# Patient Record
Sex: Female | Born: 1990 | Race: Black or African American | Hispanic: No | Marital: Single | Smoking: Never smoker
Health system: Southern US, Community
[De-identification: ages and names within clinical notes are randomized; demographics above are authoritative.]

## PROBLEM LIST (undated history)

## (undated) ENCOUNTER — Inpatient Hospital Stay (HOSPITAL_COMMUNITY): Payer: Self-pay

## (undated) DIAGNOSIS — N83209 Unspecified ovarian cyst, unspecified side: Secondary | ICD-10-CM

## (undated) DIAGNOSIS — J45909 Unspecified asthma, uncomplicated: Secondary | ICD-10-CM

## (undated) DIAGNOSIS — O139 Gestational [pregnancy-induced] hypertension without significant proteinuria, unspecified trimester: Secondary | ICD-10-CM

## (undated) DIAGNOSIS — J069 Acute upper respiratory infection, unspecified: Secondary | ICD-10-CM

## (undated) HISTORY — DX: Acute upper respiratory infection, unspecified: J06.9

## (undated) HISTORY — PX: NO PAST SURGERIES: SHX2092

---
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
## 2013-01-26 ENCOUNTER — Emergency Department (HOSPITAL_COMMUNITY): Payer: BC Managed Care – PPO

## 2013-01-26 ENCOUNTER — Encounter (HOSPITAL_COMMUNITY): Payer: Self-pay | Admitting: Emergency Medicine

## 2013-01-26 ENCOUNTER — Emergency Department (HOSPITAL_COMMUNITY)
Admission: EM | Admit: 2013-01-26 | Discharge: 2013-01-26 | Disposition: A | Discharge status: Home / Self Care | Payer: BC Managed Care – PPO | Attending: Emergency Medicine | Admitting: Emergency Medicine

## 2013-01-26 DIAGNOSIS — S8002XA Contusion of left knee, initial encounter: Secondary | ICD-10-CM

## 2013-01-26 DIAGNOSIS — J45909 Unspecified asthma, uncomplicated: Secondary | ICD-10-CM | POA: Insufficient documentation

## 2013-01-26 DIAGNOSIS — Y9389 Activity, other specified: Secondary | ICD-10-CM | POA: Insufficient documentation

## 2013-01-26 DIAGNOSIS — IMO0002 Reserved for concepts with insufficient information to code with codable children: Secondary | ICD-10-CM | POA: Insufficient documentation

## 2013-01-26 DIAGNOSIS — Z79899 Other long term (current) drug therapy: Secondary | ICD-10-CM | POA: Insufficient documentation

## 2013-01-26 DIAGNOSIS — S0990XA Unspecified injury of head, initial encounter: Secondary | ICD-10-CM | POA: Insufficient documentation

## 2013-01-26 DIAGNOSIS — S8000XA Contusion of unspecified knee, initial encounter: Secondary | ICD-10-CM | POA: Insufficient documentation

## 2013-01-26 DIAGNOSIS — Y92410 Unspecified street and highway as the place of occurrence of the external cause: Secondary | ICD-10-CM | POA: Insufficient documentation

## 2013-01-26 HISTORY — DX: Unspecified asthma, uncomplicated: J45.909

## 2013-01-26 MED ORDER — CYCLOBENZAPRINE HCL 10 MG PO TABS: 10.0000 mg | ORAL_TABLET | Freq: Two times a day (BID) | ORAL | Status: DC | PRN

## 2013-01-26 NOTE — ED Notes (Signed)
The pt just returned from xray 
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
## 2013-01-26 NOTE — ED Provider Notes (Signed)
Medical screening examination/treatment/procedure(s) were performed by non-physician practitioner and as supervising physician I was immediately available for consultation/collaboration.    Denetria Luevanos R Jameria Bradway, MD 01/26/13 1849 
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
## 2013-02-23 ENCOUNTER — Emergency Department (HOSPITAL_COMMUNITY)
Admission: EM | Admit: 2013-02-23 | Discharge: 2013-02-23 | Discharge status: Against Medical Advice | Payer: No Typology Code available for payment source | Attending: Emergency Medicine | Admitting: Emergency Medicine

## 2013-02-23 ENCOUNTER — Encounter (HOSPITAL_COMMUNITY): Payer: Self-pay | Admitting: Emergency Medicine

## 2013-02-23 DIAGNOSIS — Y9389 Activity, other specified: Secondary | ICD-10-CM | POA: Insufficient documentation

## 2013-02-23 DIAGNOSIS — J45909 Unspecified asthma, uncomplicated: Secondary | ICD-10-CM | POA: Insufficient documentation

## 2013-02-23 DIAGNOSIS — X500XXA Overexertion from strenuous movement or load, initial encounter: Secondary | ICD-10-CM | POA: Insufficient documentation

## 2013-02-23 DIAGNOSIS — S239XXA Sprain of unspecified parts of thorax, initial encounter: Secondary | ICD-10-CM

## 2013-02-23 DIAGNOSIS — Z79899 Other long term (current) drug therapy: Secondary | ICD-10-CM | POA: Insufficient documentation

## 2013-02-23 DIAGNOSIS — Y92009 Unspecified place in unspecified non-institutional (private) residence as the place of occurrence of the external cause: Secondary | ICD-10-CM | POA: Insufficient documentation

## 2013-02-23 MED ORDER — KETOROLAC TROMETHAMINE 30 MG/ML IJ SOLN
30.0000 mg | Freq: Once | INTRAMUSCULAR | Status: AC
  Administered 2013-02-23: 30 mg via INTRAMUSCULAR
  Filled 2013-02-23: qty 1

## 2013-02-23 NOTE — ED Provider Notes (Signed)
Medical screening examination/treatment/procedure(s) were performed by non-physician practitioner and as supervising physician I was immediately available for consultation/collaboration.  EKG Interpretation   None         Marks Scalera M ZavEnid Skeensitz, MD 02/23/13 [phone]

## 2013-02-23 NOTE — ED Notes (Addendum)
Pt did not sign, AMA, pt would not wait to speak to NP. Pt walked out.
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
## 2013-02-23 NOTE — ED Provider Notes (Signed)
CSN: [id number]     Arrival date & time 02/23/13  0116 History   First MD Initiated Contact with Patient 02/23/13 0150     Chief Complaint  Patient presents with  . Neck Pain  . Back Pain   (Consider location/radiation/quality/duration/timing/severity/associated sxs/prior Treatment) HPI Comments: [facility] was getting better but picked up a heavy launder basket tonight and felt a pop and pain in neck that has not resolved with Advil   Patient is a 23 y.o. female presenting with neck pain and back pain. The history is provided by the patient.  Neck Pain Pain location:  Generalized neck Quality:  Aching Pain radiates to:  Does not radiate Pain severity:  Mild Onset quality:  Sudden Duration:  2 hours Timing:  Constant Progression:  Unchanged Chronicity:  New Context: lifting a heavy object   Relieved by:  Nothing Worsened by:  Bending Ineffective treatments:  NSAIDs Associated symptoms: no fever, no headaches, no numbness and no weakness   Back Pain Associated symptoms: no fever, no headaches, no numbness and no weakness     Past Medical History  Diagnosis Date  . Asthma    History reviewed. No pertinent past surgical history. No family history on file. History  Substance Use Topics  . Smoking status: Never Smoker   . Smokeless tobacco: Not on file  . Alcohol Use: No     Comment: occ   OB History   Grav Para Term Preterm Abortions TAB SAB Ect Mult Living                 Review of Systems  Constitutional: Negative for fever.  Musculoskeletal: Positive for back pain and neck pain. Negative for neck stiffness.  Neurological: Negative for dizziness, weakness, numbness and headaches.  All other systems reviewed and are negative.    Allergies  Review of patient's allergies indicates no known allergies.  Home Medications   Current Outpatient Rx  Name  Route  Sig  Dispense  Refill  . albuterol (PROVENTIL HFA;VENTOLIN HFA) 108 (90 BASE) MCG/ACT  inhaler   Inhalation   Inhale 1-2 puffs into the lungs every 6 (six) hours as needed for wheezing or shortness of breath.         Marland Kitchen. albuterol (PROVENTIL) (2.5 MG/3ML) 0.083% nebulizer solution   Nebulization   Take 2.5 mg by nebulization every 6 (six) hours as needed for wheezing or shortness of breath.         . cyclobenzaprine (FLEXERIL) 10 MG tablet   Oral   Take 1 tablet (10 mg total) by mouth 2 (two) times daily as needed for muscle spasms.   20 tablet   0    BP 124/77  Pulse 74  Temp(Src) 98.2 F (36.8 C) (Oral)  Resp 16  SpO2 99% Physical Exam  Nursing note and vitals reviewed. Constitutional: She is oriented to person, place, and time. She appears well-developed and well-nourished.  Eyes: Pupils are equal, round, and reactive to light.  Neck: Normal range of motion.  Cardiovascular: Normal rate and regular rhythm.   Pulmonary/Chest: Effort normal.  Abdominal: Soft.  Musculoskeletal:       Back:  Neurological: She is alert and oriented to person, place, and time.  Skin: Skin is warm and dry.    ED Course  Procedures (including critical care time) Labs Review Labs Reviewed - No data to display Imaging Review No results found.  EKG Interpretation   None       MDM  1. Thoracic back sprain     Patient refused care demanded xray     Arman Filter, NP 02/23/13 1610

## 2013-02-23 NOTE — ED Notes (Signed)
Pt asked if they were going to do x-rays. Pt was told that it was a muscle sprain and that x-ray would not help with her diagnoses. Pt walked out.

## 2013-10-26 ENCOUNTER — Emergency Department (HOSPITAL_COMMUNITY): Payer: Self-pay

## 2013-10-26 ENCOUNTER — Emergency Department (HOSPITAL_COMMUNITY): Payer: BC Managed Care – PPO

## 2013-10-26 ENCOUNTER — Emergency Department (HOSPITAL_COMMUNITY)
Admission: EM | Admit: 2013-10-26 | Discharge: 2013-10-26 | Disposition: A | Discharge status: Home / Self Care | Payer: Self-pay | Attending: Emergency Medicine | Admitting: Emergency Medicine

## 2013-10-26 ENCOUNTER — Encounter (HOSPITAL_COMMUNITY): Payer: Self-pay | Admitting: Emergency Medicine

## 2013-10-26 DIAGNOSIS — R1031 Right lower quadrant pain: Secondary | ICD-10-CM | POA: Insufficient documentation

## 2013-10-26 DIAGNOSIS — Z79899 Other long term (current) drug therapy: Secondary | ICD-10-CM | POA: Insufficient documentation

## 2013-10-26 DIAGNOSIS — N83209 Unspecified ovarian cyst, unspecified side: Secondary | ICD-10-CM | POA: Insufficient documentation

## 2013-10-26 DIAGNOSIS — N83201 Unspecified ovarian cyst, right side: Secondary | ICD-10-CM

## 2013-10-26 DIAGNOSIS — N83202 Unspecified ovarian cyst, left side: Secondary | ICD-10-CM

## 2013-10-26 DIAGNOSIS — R079 Chest pain, unspecified: Secondary | ICD-10-CM | POA: Insufficient documentation

## 2013-10-26 DIAGNOSIS — R071 Chest pain on breathing: Secondary | ICD-10-CM | POA: Insufficient documentation

## 2013-10-26 DIAGNOSIS — R0789 Other chest pain: Secondary | ICD-10-CM

## 2013-10-26 DIAGNOSIS — Z3202 Encounter for pregnancy test, result negative: Secondary | ICD-10-CM | POA: Insufficient documentation

## 2013-10-26 DIAGNOSIS — R1013 Epigastric pain: Secondary | ICD-10-CM | POA: Insufficient documentation

## 2013-10-26 DIAGNOSIS — J45909 Unspecified asthma, uncomplicated: Secondary | ICD-10-CM | POA: Insufficient documentation

## 2013-10-26 LAB — COMPREHENSIVE METABOLIC PANEL
ALK PHOS: 82 U/L (ref 39–117)
ALT: 18 U/L (ref 0–35)
AST: 25 U/L (ref 0–37)
Albumin: 3.5 g/dL (ref 3.5–5.2)
Anion gap: 12 (ref 5–15)
BUN: 8 mg/dL (ref 6–23)
CO2: 23 meq/L (ref 19–32)
Calcium: 8.8 mg/dL (ref 8.4–10.5)
Chloride: 104 mEq/L (ref 96–112)
Creatinine, Ser: 0.59 mg/dL (ref 0.50–1.10)
GFR calc non Af Amer: >90 mL/min (ref >90)
Glucose, Bld: 89 mg/dL (ref 70–99)
POTASSIUM: 3.9 meq/L (ref 3.7–5.3)
SODIUM: 139 meq/L (ref 137–147)
TOTAL PROTEIN: 6.8 g/dL (ref 6.0–8.3)
Total Bilirubin: 0.5 mg/dL (ref 0.3–1.2)

## 2013-10-26 LAB — CBC WITH DIFFERENTIAL/PLATELET
Basophils Absolute: 0 10*3/uL (ref 0.0–0.1)
Basophils Relative: 0 % (ref 0–1)
EOS ABS: 0.4 10*3/uL (ref 0.0–0.7)
Eosinophils Relative: 5 % (ref 0–5)
HCT: 38.7 % (ref 36.0–46.0)
Hemoglobin: 12.5 g/dL (ref 12.0–15.0)
LYMPHS ABS: 2.1 10*3/uL (ref 0.7–4.0)
LYMPHS PCT: 31 % (ref 12–46)
MCH: 27.7 pg (ref 26.0–34.0)
MCHC: 32.3 g/dL (ref 30.0–36.0)
MCV: 85.8 fL (ref 78.0–100.0)
Monocytes Absolute: 0.3 10*3/uL (ref 0.1–1.0)
Monocytes Relative: 5 % (ref 3–12)
NEUTROS PCT: 59 % (ref 43–77)
Neutro Abs: 4 10*3/uL (ref 1.7–7.7)
PLATELETS: 224 10*3/uL (ref 150–400)
RBC: 4.51 MIL/uL (ref 3.87–5.11)
RDW: 11.9 % (ref 11.5–15.5)
WBC: 6.8 10*3/uL (ref 4.0–10.5)

## 2013-10-26 LAB — URINALYSIS, ROUTINE W REFLEX MICROSCOPIC
BILIRUBIN URINE: NEGATIVE
Glucose, UA: NEGATIVE mg/dL
Hgb urine dipstick: NEGATIVE
Ketones, ur: NEGATIVE mg/dL
Leukocytes, UA: NEGATIVE
NITRITE: NEGATIVE
PROTEIN: NEGATIVE mg/dL
SPECIFIC GRAVITY, URINE: 1.025 (ref 1.005–1.030)
UROBILINOGEN UA: 1 mg/dL (ref 0.0–1.0)
pH: 7 (ref 5.0–8.0)

## 2013-10-26 LAB — LIPASE, BLOOD: Lipase: 42 U/L (ref 11–59)

## 2013-10-26 LAB — WET PREP, GENITAL
CLUE CELLS WET PREP: NONE SEEN
TRICH WET PREP: NONE SEEN
Yeast Wet Prep HPF POC: NONE SEEN

## 2013-10-26 LAB — POC URINE PREG, ED: Preg Test, Ur: NEGATIVE

## 2013-10-26 MED ORDER — HYDROCODONE-ACETAMINOPHEN 5-325 MG PO TABS
1.0000 | ORAL_TABLET | Freq: Once | ORAL | Status: AC
  Administered 2013-10-26: 1 via ORAL
  Filled 2013-10-26: qty 1

## 2013-10-26 MED ORDER — MELOXICAM 7.5 MG PO TABS: 7.5000 mg | ORAL_TABLET | Freq: Every day | ORAL | Status: DC

## 2013-10-26 MED ORDER — GI COCKTAIL ~~LOC~~
30.0000 mL | Freq: Once | ORAL | Status: AC
  Administered 2013-10-26: 30 mL via ORAL
  Filled 2013-10-26 (×2): qty 30

## 2013-10-26 NOTE — ED Provider Notes (Signed)
Plains of anterior chest pain substernal pleuritic in nature nonradiating onset this morning. No shortness of breath. No treatment prior to coming here. Also complains of pain at right groin for 3 months, which she had formerly treated with Tylenol with relief. No vaginal discharge. Last menstrual period ended 2 days ago. Normal. No urinary symptoms. No anorexia. No fever.. On exam no distress lungs clear auscultation heart regular rate and rhythm no murmurs or rubs abdomen nondistended nontender  Doug Sou, MD 10/26/13 1147

## 2013-10-26 NOTE — ED Notes (Signed)
Pelvic cart set up at bedside  

## 2013-10-26 NOTE — ED Notes (Signed)
Pt sts lower abd pain in pelvic area x 1 month with mid sternal CP x 1 day

## 2013-10-26 NOTE — ED Provider Notes (Signed)
Medical screening examination/treatment/procedure(s) were conducted as a shared visit with non-physician practitioner(s) and myself.  I personally evaluated the patient during the encounter.   EKG Interpretation   Date/Time:  Tuesday October 26 2013 09:59:40 EDT Ventricular Rate:  82 PR Interval:  126 QRS Duration: 74 QT Interval:  362 QTC Calculation: 422 R Axis:   78 Text Interpretation:  Normal sinus rhythm with sinus arrhythmia Normal ECG  No significant change since last tracing Confirmed by Ethelda Chick  MD, Momodou Consiglio  ([id number]) on 10/26/2013 10:06:38 AM       Doug Sou, MD 10/26/13 1627

## 2013-10-26 NOTE — ED Provider Notes (Signed)
CSN: [id number]     Arrival date & time 10/26/13  4132 History   First MD Initiated Contact with Patient 10/26/13 1008     Chief Complaint  Patient presents with  . Chest Pain  . Abdominal Pain     (Consider location/radiation/quality/duration/timing/severity/associated sxs/prior Treatment) HPI Melody Farley is a 23 y.o. female who presents emergency department complaining of lower abdominal pain for one month and chest pain onset today. Patient states lower bowel pain is in the right adnexa, comes and goes, states came back yesterday and has been constant since then. She also reports onset of epigastric abdominal/lower chest pain that started this morning. States pain is constant as well. Does not radiate. No acid reflux symptoms. Denies any fever or chills. No cough. Symptoms are not worsened with deep breathing, no recent travel or surgeries. No swelling in her legs. States just went off her menstrual cycle yesterday, the possibility of being pregnant given she has not had intercourse in 3 years. Denies any vaginal discharge. Denies any urinary symptoms. She did not take anything for her symptoms today. She has been taking Tylenol intermittently for this pelvic pain.  Past Medical History  Diagnosis Date  . Asthma    History reviewed. No pertinent past surgical history. History reviewed. No pertinent family history. History  Substance Use Topics  . Smoking status: Never Smoker   . Smokeless tobacco: Not on file  . Alcohol Use: No     Comment: occ   OB History   Grav Para Term Preterm Abortions TAB SAB Ect Mult Living                 Review of Systems  Constitutional: Negative for fever and chills.  Respiratory: Negative for cough, chest tightness and shortness of breath.   Cardiovascular: Positive for chest pain. Negative for palpitations and leg swelling.  Gastrointestinal: Positive for abdominal pain. Negative for nausea, vomiting and diarrhea.  Genitourinary: Positive  for pelvic pain. Negative for dysuria, flank pain, vaginal bleeding, vaginal discharge and vaginal pain.  Musculoskeletal: Negative for arthralgias, myalgias, neck pain and neck stiffness.  Skin: Negative for rash.  Neurological: Negative for dizziness, weakness and headaches.  All other systems reviewed and are negative.     Allergies  Review of patient's allergies indicates no known allergies.  Home Medications   Prior to Admission medications   Medication Sig Start Date End Date Taking? Authorizing Provider  albuterol (PROVENTIL HFA;VENTOLIN HFA) 108 (90 BASE) MCG/ACT inhaler Inhale 1-2 puffs into the lungs every 6 (six) hours as needed for wheezing or shortness of breath.    Historical Provider, MD  albuterol (PROVENTIL) (2.5 MG/3ML) 0.083% nebulizer solution Take 2.5 mg by nebulization every 6 (six) hours as needed for wheezing or shortness of breath.    Historical Provider, MD  cyclobenzaprine (FLEXERIL) 10 MG tablet Take 1 tablet (10 mg total) by mouth 2 (two) times daily as needed for muscle spasms. 01/26/13   Jimmye Norman, NP   BP 130/78  Pulse 72  Temp(Src) 98 F (36.7 C) (Oral)  Resp 15  Ht  (1.626 m)  Wt 150 lb (68.04 kg)  BMI 25.73 kg/m2  SpO2 97% Physical Exam  Nursing note and vitals reviewed. Constitutional: She appears well-developed and well-nourished. No distress.  HENT:  Head: Normocephalic.  Eyes: Conjunctivae are normal.  Neck: Neck supple.  Cardiovascular: Normal rate, regular rhythm and normal heart sounds.   Pulmonary/Chest: Effort normal and breath sounds normal. No respiratory distress. She  has no wheezes. She has no rales. She exhibits tenderness.  Tenderness over midline lower sternum.  Abdominal: Soft. Bowel sounds are normal. She exhibits no distension. There is tenderness. There is no rebound and no guarding.  The right lower quadrant tenderness, epigastric tenderness.  Genitourinary:  Normal external genitalia. No vaginal discharge.  Cervix is normal. No cervical motion tenderness. Right adnexal tenderness. No left adnexal tenderness  Musculoskeletal: Normal range of motion. She exhibits no edema.  No tenderness to palpation over lower legs or calves.  Neurological: She is alert.  Skin: Skin is warm and dry.  Psychiatric: She has a normal mood and affect. Her behavior is normal.    ED Course  Procedures (including critical care time) Labs Review Labs Reviewed  WET PREP, GENITAL - Abnormal; Notable for the following:    WBC, Wet Prep HPF POC FEW (*)    All other components within normal limits  URINALYSIS, ROUTINE W REFLEX MICROSCOPIC - Abnormal; Notable for the following:    APPearance CLOUDY (*)    All other components within normal limits  GC/CHLAMYDIA PROBE AMP  CBC WITH DIFFERENTIAL  COMPREHENSIVE METABOLIC PANEL  LIPASE, BLOOD  POC URINE PREG, ED    Imaging Review Dg Chest 2 View  10/26/2013   CLINICAL DATA:  Chest pain and congestion  EXAM: CHEST  2 VIEW  COMPARISON:  Chest radiograph and chest CT May 08, 2009  FINDINGS: The lungs are clear. Heart size and pulmonary vascularity are normal. No adenopathy. No pneumothorax. No bone lesions.  IMPRESSION: No abnormality noted.   Electronically Signed   By: Bretta Bang M.D.   On: 10/26/2013 12:31   US Transvaginal Non-ob  10/26/2013   CLINICAL DATA:  Right adnexal pain and abdominal and chest pain.  EXAM: TRANSABDOMINAL AND TRANSVAGINAL ULTRASOUND OF PELVIS  TECHNIQUE: Both transabdominal and transvaginal ultrasound examinations of the pelvis were performed. Transabdominal technique was performed for global imaging of the pelvis including uterus, ovaries, adnexal regions, and pelvic cul-de-sac. It was necessary to proceed with endovaginal exam following the transabdominal exam to visualize the left ovary.  COMPARISON:  None  FINDINGS: Uterus  Measurements: 6.4 x 2.9 x 4.7 cm. No fibroids or other mass visualized.  Endometrium  Thickness: 16 mm. There is a  small amount of fluid in the endometrial cavity.  Right ovary  Measurements: 3.4 x 2.4 x 2.7 cm. Simple 1.5 cm cyst in the right ovary.  Left ovary  Measurements: 2.2 x 1.6 x 1.5 cm. Simple 1.0 cm cyst in the left ovary.  Other findings  Small amount of free fluid in the pelvis.  IMPRESSION: 1. Small cysts on each ovary. 2. Small amount of fluid in endometrial cavity, nonspecific.   Electronically Signed   By: Geanie Cooley M.D.   On: 10/26/2013 14:48   US Pelvis Complete  10/26/2013   CLINICAL DATA:  Right adnexal pain and abdominal and chest pain.  EXAM: TRANSABDOMINAL AND TRANSVAGINAL ULTRASOUND OF PELVIS  TECHNIQUE: Both transabdominal and transvaginal ultrasound examinations of the pelvis were performed. Transabdominal technique was performed for global imaging of the pelvis including uterus, ovaries, adnexal regions, and pelvic cul-de-sac. It was necessary to proceed with endovaginal exam following the transabdominal exam to visualize the left ovary.  COMPARISON:  None  FINDINGS: Uterus  Measurements: 6.4 x 2.9 x 4.7 cm. No fibroids or other mass visualized.  Endometrium  Thickness: 16 mm. There is a small amount of fluid in the endometrial cavity.  Right ovary  Measurements:  3.4 x 2.4 x 2.7 cm. Simple 1.5 cm cyst in the right ovary.  Left ovary  Measurements: 2.2 x 1.6 x 1.5 cm. Simple 1.0 cm cyst in the left ovary.  Other findings  Small amount of free fluid in the pelvis.  IMPRESSION: 1. Small cysts on each ovary. 2. Small amount of fluid in endometrial cavity, nonspecific.   Electronically Signed   By: Geanie Cooley M.D.   On: 10/26/2013 14:48      EKG Interpretation   Date/Time:  Tuesday October 26 2013 09:59:40 EDT Ventricular Rate:  82 PR Interval:  126 QRS Duration: 74 QT Interval:  362 QTC Calculation: 422 R Axis:   78 Text Interpretation:  Normal sinus rhythm with sinus arrhythmia Normal ECG  No significant change since last tracing Confirmed by JACUBOWITZ  MD, SAM  ([id number]) on  10/26/2013 10:06:38 AM      MDM   Final diagnoses:  Chest wall pain  Bilateral ovarian cysts    10:40 AM  Patient seen and examined, 2 separate complaints. Complaining of right adnexal pain for about a month. Constant and more severe since yesterday. Also reports epigastric or lower retrosternal chest pain, reproducible with palpation, no associated symptoms. Patient is PERC negative. There is factors for coronary disease. Will get chest x-ray, abdominal labs, UA, urine preg, pelvic exam, and GI cocktail ordered. Will monitor.   3:55 PM Patient's labs are unremarkable. Urinalysis negative. Urine pregnancy is negative. Ultrasound obtained due to right adnexal tenderness, and is negative other than bilateral small cysts. Small amount of fluid in endometrial cavity noted as well. Patient is nontoxic appearing this time. Chest pain resolved. I suspect she most likely has costochondritis. Will treat with NSAIDs. Followup with her primary care Dr.  Ceasar Mons Vitals:   10/26/13 1330 10/26/13 1354 10/26/13 1530 10/26/13 1538  BP: 104/70 119/73 120/78   Pulse: 61 65 68   Temp:    98.1 F (36.7 C)  TempSrc:    Oral  Resp: Height:      Weight:      SpO2: 93% 99% 95%      Lottie Mussel, PA-C 10/26/13 1559

## 2013-10-26 NOTE — ED Notes (Signed)
2 RN's and phlebotomy attempted to obtain labs. Notified PA. Will continue to try

## 2013-10-26 NOTE — Discharge Instructions (Signed)
mobic for pain. Follow up with your doctor. Your ultrasound showed ovarian cysts. Your blood work and ultrasound normal otherwise.   Ovarian Cyst An ovarian cyst is a fluid-filled sac that forms on an ovary. The ovaries are small organs that produce eggs in women. Various types of cysts can form on the ovaries. Most are not cancerous. Many do not cause problems, and they often go away on their own. Some may cause symptoms and require treatment. Common types of ovarian cysts include:  Functional cysts--These cysts may occur every month during the menstrual cycle. This is normal. The cysts usually go away with the next menstrual cycle if the woman does not get pregnant. Usually, there are no symptoms with a functional cyst.  Endometrioma cysts--These cysts form from the tissue that lines the uterus. They are also called "chocolate cysts" because they become filled with blood that turns brown. This type of cyst can cause pain in the lower abdomen during intercourse and with your menstrual period.  Cystadenoma cysts--This type develops from the cells on the outside of the ovary. These cysts can get very big and cause lower abdomen pain and pain with intercourse. This type of cyst can twist on itself, cut off its blood supply, and cause severe pain. It can also easily rupture and cause a lot of pain.  Dermoid cysts--This type of cyst is sometimes found in both ovaries. These cysts may contain different kinds of body tissue, such as skin, teeth, hair, or cartilage. They usually do not cause symptoms unless they get very big.  Theca lutein cysts--These cysts occur when too much of a certain hormone (human chorionic gonadotropin) is produced and overstimulates the ovaries to produce an egg. This is most common after procedures used to assist with the conception of a baby (in vitro fertilization). CAUSES   Fertility drugs can cause a condition in which multiple large cysts are formed on the ovaries. This is  called ovarian hyperstimulation syndrome.  A condition called polycystic ovary syndrome can cause hormonal imbalances that can lead to nonfunctional ovarian cysts. SIGNS AND SYMPTOMS  Many ovarian cysts do not cause symptoms. If symptoms are present, they may include:  Pelvic pain or pressure.  Pain in the lower abdomen.  Pain during sexual intercourse.  Increasing girth (swelling) of the abdomen.  Abnormal menstrual periods.  Increasing pain with menstrual periods.  Stopping having menstrual periods without being pregnant. DIAGNOSIS  These cysts are commonly found during a routine or annual pelvic exam. Tests may be ordered to find out more about the cyst. These tests may include:  Ultrasound.  X-ray of the pelvis.  CT scan.  MRI.  Blood tests. TREATMENT  Many ovarian cysts go away on their own without treatment. Your health care provider may want to check your cyst regularly for 2-3 months to see if it changes. For women in menopause, it is particularly important to monitor a cyst closely because of the higher rate of ovarian cancer in menopausal women. When treatment is needed, it may include any of the following:  A procedure to drain the cyst (aspiration). This may be done using a long needle and ultrasound. It can also be done through a laparoscopic procedure. This involves using a thin, lighted tube with a tiny camera on the end (laparoscope) inserted through a small incision.  Surgery to remove the whole cyst. This may be done using laparoscopic surgery or an open surgery involving a larger incision in the lower abdomen.  Hormone treatment  or birth control pills. These methods are sometimes used to help dissolve a cyst. HOME CARE INSTRUCTIONS   Only take over-the-counter or prescription medicines as directed by your health care provider.  Follow up with your health care provider as directed.  Get regular pelvic exams and Pap tests. SEEK MEDICAL CARE IF:   Your  periods are late, irregular, or painful, or they stop.  Your pelvic pain or abdominal pain does not go away.  Your abdomen becomes larger or swollen.  You have pressure on your bladder or trouble emptying your bladder completely.  You have pain during sexual intercourse.  You have feelings of fullness, pressure, or discomfort in your stomach.  You lose weight for no apparent reason.  You feel generally ill.  You become constipated.  You lose your appetite.  You develop acne.  You have an increase in body and facial hair.  You are gaining weight, without changing your exercise and eating habits.  You think you are pregnant. SEEK IMMEDIATE MEDICAL CARE IF:   You have increasing abdominal pain.  You feel sick to your stomach (nauseous), and you throw up (vomit).  You develop a fever that comes on suddenly.  You have abdominal pain during a bowel movement.  Your menstrual periods become heavier than usual. MAKE SURE YOU:  Understand these instructions.  Will watch your condition.  Will get help right away if you are not doing well or get worse. Document Released: 01/21/2005 Document Revised: 01/26/2013 Document Reviewed: 09/28/2012 [facility] Patient Information 2015 [place], [place]. This information is not intended to replace advice given to you by your health care provider. Make sure you discuss any questions you have with your health care provider.    Chest Wall Pain Chest wall pain is pain in or around the bones and muscles of your chest. It may take up to 6 weeks to get better. It may take longer if you must stay physically active in your work and activities.  CAUSES  Chest wall pain may happen on its own. However, it may be caused by:  A viral illness like the flu.  Injury.  Coughing.  Exercise.  Arthritis.  Fibromyalgia.  Shingles. HOME CARE INSTRUCTIONS   Avoid overtiring physical activity. Try not to strain or perform activities that cause  pain. This includes any activities using your chest or your abdominal and side muscles, especially if heavy weights are used.  Put ice on the sore area.  Put ice in a plastic bag.  Place a towel between your skin and the bag.  Leave the ice on for 15-20 minutes per hour while awake for the first 2 days.  Only take over-the-counter or prescription medicines for pain, discomfort, or fever as directed by your caregiver. SEEK IMMEDIATE MEDICAL CARE IF:   Your pain increases, or you are very uncomfortable.  You have a fever.  Your chest pain becomes worse.  You have new, unexplained symptoms.  You have nausea or vomiting.  You feel sweaty or lightheaded.  You have a cough with phlegm (sputum), or you cough up blood. MAKE SURE YOU:   Understand these instructions.  Will watch your condition.  Will get help right away if you are not doing well or get worse. Document Released: 01/21/2005 Document Revised: 04/15/2011 Document Reviewed: 09/17/2010 [facility] Patient Information 2015 [place], [place]. This information is not intended to replace advice given to you by your health care provider. Make sure you discuss any questions you have with your health care  provider.

## 2013-10-27 LAB — GC/CHLAMYDIA PROBE AMP
CT Probe RNA: NEGATIVE
GC PROBE AMP APTIMA: NEGATIVE

## 2013-11-01 ENCOUNTER — Telehealth: Payer: Self-pay | Admitting: Obstetrics and Gynecology

## 2013-11-01 NOTE — Telephone Encounter (Signed)
Calling patient. Unable to leave voicemail.  Will have try again.

## 2013-11-01 NOTE — Telephone Encounter (Signed)
Pt calling wanting a ngyn appt for 2nd opinion for a cyst on her ovary. Says that she was assessed at the ER and was told to follow up with a GYN. Pt states she has been having a lot of pain for the last 3 weeks. I got pt currently scheduled for 12/13/13 at 2:00 with Dr Edward Jolly. Pt would like to come in sooner if possible. Was told to send to triage for further evaluation.

## 2013-11-03 NOTE — Telephone Encounter (Signed)
Spoke with patient. Appointment moved to Oct 19th at 8am with Dr.Lathrop. Patient is agreeable to date and time.  Routing to provider for final review. Patient agreeable to disposition. Will close encounter

## 2013-11-22 ENCOUNTER — Encounter: Payer: Self-pay | Admitting: Gynecology

## 2013-12-13 ENCOUNTER — Encounter: Payer: Self-pay | Admitting: Obstetrics and Gynecology

## 2014-03-21 ENCOUNTER — Inpatient Hospital Stay (HOSPITAL_COMMUNITY)
Admission: AD | Admit: 2014-03-21 | Discharge: 2014-03-22 | Disposition: A | Discharge status: Home / Self Care | Payer: BLUE CROSS/BLUE SHIELD | Source: Ambulatory Visit | Attending: Obstetrics & Gynecology | Admitting: Obstetrics & Gynecology

## 2014-03-21 ENCOUNTER — Encounter (HOSPITAL_COMMUNITY): Payer: Self-pay | Admitting: *Deleted

## 2014-03-21 DIAGNOSIS — Z3A18 18 weeks gestation of pregnancy: Secondary | ICD-10-CM | POA: Diagnosis not present

## 2014-03-21 DIAGNOSIS — Z3492 Encounter for supervision of normal pregnancy, unspecified, second trimester: Secondary | ICD-10-CM

## 2014-03-21 DIAGNOSIS — O9989 Other specified diseases and conditions complicating pregnancy, childbirth and the puerperium: Secondary | ICD-10-CM | POA: Insufficient documentation

## 2014-03-21 DIAGNOSIS — Y92410 Unspecified street and highway as the place of occurrence of the external cause: Secondary | ICD-10-CM | POA: Diagnosis not present

## 2014-03-21 DIAGNOSIS — O9A212 Injury, poisoning and certain other consequences of external causes complicating pregnancy, second trimester: Secondary | ICD-10-CM

## 2014-03-21 LAB — CBC
HCT: 34 % — ABNORMAL LOW (ref 36.0–46.0)
Hemoglobin: 11.3 g/dL — ABNORMAL LOW (ref 12.0–15.0)
MCH: 28.3 pg (ref 26.0–34.0)
MCHC: 33.2 g/dL (ref 30.0–36.0)
MCV: 85.2 fL (ref 78.0–100.0)
Platelets: 218 10*3/uL (ref 150–400)
RBC: 3.99 MIL/uL (ref 3.87–5.11)
RDW: 12.7 % (ref 11.5–15.5)
WBC: 8.3 10*3/uL (ref 4.0–10.5)

## 2014-03-21 LAB — PROTIME-INR
INR: 0.95 (ref 0.00–1.49)
Prothrombin Time: 12.8 seconds (ref 11.6–15.2)

## 2014-03-21 LAB — APTT: APTT: 26 s (ref 24–37)

## 2014-03-21 LAB — KLEIHAUER-BETKE STAIN
# Vials RhIg: 1
FETAL CELLS %: 0 %
QUANTITATION FETAL HEMOGLOBIN: 0 mL

## 2014-03-21 LAB — FIBRINOGEN: Fibrinogen: 491 mg/dL — ABNORMAL HIGH (ref 204–475)

## 2014-03-21 NOTE — Discharge Instructions (Signed)
Second Trimester of Pregnancy The second trimester is from week 13 through week 28, months 4 through 6. The second trimester is often a time when you feel your best. Your body has also adjusted to being pregnant, and you begin to feel better physically. Usually, morning sickness has lessened or quit completely, you may have more energy, and you may have an increase in appetite. The second trimester is also a time when the fetus is growing rapidly. At the end of the sixth month, the fetus is about 9 inches long and weighs about 1 pounds. You will likely begin to feel the baby move (quickening) between 18 and 20 weeks of the pregnancy. BODY CHANGES Your body goes through many changes during pregnancy. The changes vary from woman to woman.   Your weight will continue to increase. You will notice your lower abdomen bulging out.  You may begin to get stretch marks on your hips, abdomen, and breasts.  You may develop headaches that can be relieved by medicines approved by your health care provider.  You may urinate more often because the fetus is pressing on your bladder.  You may develop or continue to have heartburn as a result of your pregnancy.  You may develop constipation because certain hormones are causing the muscles that push waste through your intestines to slow down.  You may develop hemorrhoids or swollen, bulging veins (varicose veins).  You may have back pain because of the weight gain and pregnancy hormones relaxing your joints between the bones in your pelvis and as a result of a shift in weight and the muscles that support your balance.  Your breasts will continue to grow and be tender.  Your gums may bleed and may be sensitive to brushing and flossing.  Dark spots or blotches (chloasma, mask of pregnancy) may develop on your face. This will likely fade after the baby is born.  A dark line from your belly button to the pubic area (linea nigra) may appear. This will likely fade  after the baby is born.  You may have changes in your hair. These can include thickening of your hair, rapid growth, and changes in texture. Some women also have hair loss during or after pregnancy, or hair that feels dry or thin. Your hair will most likely return to normal after your baby is born. WHAT TO EXPECT AT YOUR PRENATAL VISITS During a routine prenatal visit:  You will be weighed to make sure you and the fetus are growing normally.  Your blood pressure will be taken.  Your abdomen will be measured to track your baby's growth.  The fetal heartbeat will be listened to.  Any test results from the previous visit will be discussed. Your health care provider may ask you:  How you are feeling.  If you are feeling the baby move.  If you have had any abnormal symptoms, such as leaking fluid, bleeding, severe headaches, or abdominal cramping.  If you have any questions. Other tests that may be performed during your second trimester include:  Blood tests that check for:  Low iron levels (anemia).  Gestational diabetes (between 24 and 28 weeks).  Rh antibodies.  Urine tests to check for infections, diabetes, or protein in the urine.  An ultrasound to confirm the proper growth and development of the baby.  An amniocentesis to check for possible genetic problems.  Fetal screens for spina bifida and Down syndrome. HOME CARE INSTRUCTIONS   Avoid all smoking, herbs, alcohol, and unprescribed   drugs. These chemicals affect the formation and growth of the baby.  Follow your health care provider's instructions regarding medicine use. There are medicines that are either safe or unsafe to take during pregnancy.  Exercise only as directed by your health care provider. Experiencing uterine cramps is a good sign to stop exercising.  Continue to eat regular, healthy meals.  Wear a good support bra for breast tenderness.  Do not use hot tubs, steam rooms, or saunas.  Wear your  seat belt at all times when driving.  Avoid raw meat, uncooked cheese, cat litter boxes, and soil used by cats. These carry germs that can cause birth defects in the baby.  Take your prenatal vitamins.  Try taking a stool softener (if your health care provider approves) if you develop constipation. Eat more high-fiber foods, such as fresh vegetables or fruit and whole grains. Drink plenty of fluids to keep your urine clear or pale yellow.  Take warm sitz baths to soothe any pain or discomfort caused by hemorrhoids. Use hemorrhoid cream if your health care provider approves.  If you develop varicose veins, wear support hose. Elevate your feet for 15 minutes, 3-4 times a day. Limit salt in your diet.  Avoid heavy lifting, wear low heel shoes, and practice good posture.  Rest with your legs elevated if you have leg cramps or low back pain.  Visit your dentist if you have not gone yet during your pregnancy. Use a soft toothbrush to brush your teeth and be gentle when you floss.  A sexual relationship may be continued unless your health care provider directs you otherwise.  Continue to go to all your prenatal visits as directed by your health care provider. SEEK MEDICAL CARE IF:   You have dizziness.  You have mild pelvic cramps, pelvic pressure, or nagging pain in the abdominal area.  You have persistent nausea, vomiting, or diarrhea.  You have a bad smelling vaginal discharge.  You have pain with urination. SEEK IMMEDIATE MEDICAL CARE IF:   You have a fever.  You are leaking fluid from your vagina.  You have spotting or bleeding from your vagina.  You have severe abdominal cramping or pain.  You have rapid weight gain or loss.  You have shortness of breath with chest pain.  You notice sudden or extreme swelling of your face, hands, ankles, feet, or legs.  You have not felt your baby move in over an hour.  You have severe headaches that do not go away with  medicine.  You have vision changes. Document Released: 01/15/2001 Document Revised: 01/26/2013 Document Reviewed: 03/24/2012 [employer] Patient Information 2015 [employer]. This information is not intended to replace advice given to you by your health care provider. Make sure you discuss any questions you have with your health care provider.  
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
## 2014-07-07 ENCOUNTER — Encounter (HOSPITAL_COMMUNITY): Payer: Self-pay | Admitting: *Deleted

## 2014-07-07 ENCOUNTER — Inpatient Hospital Stay (HOSPITAL_COMMUNITY)
Admission: AD | Admit: 2014-07-07 | Discharge: 2014-07-07 | Disposition: A | Discharge status: Home / Self Care | Payer: BLUE CROSS/BLUE SHIELD | Source: Ambulatory Visit | Attending: Obstetrics and Gynecology | Admitting: Obstetrics and Gynecology

## 2014-07-07 DIAGNOSIS — Z79899 Other long term (current) drug therapy: Secondary | ICD-10-CM | POA: Insufficient documentation

## 2014-07-07 DIAGNOSIS — O99513 Diseases of the respiratory system complicating pregnancy, third trimester: Secondary | ICD-10-CM | POA: Diagnosis not present

## 2014-07-07 DIAGNOSIS — Z3A34 34 weeks gestation of pregnancy: Secondary | ICD-10-CM | POA: Diagnosis not present

## 2014-07-07 DIAGNOSIS — J45909 Unspecified asthma, uncomplicated: Secondary | ICD-10-CM | POA: Insufficient documentation

## 2014-07-07 DIAGNOSIS — Z8249 Family history of ischemic heart disease and other diseases of the circulatory system: Secondary | ICD-10-CM | POA: Diagnosis not present

## 2014-07-07 DIAGNOSIS — Z91040 Latex allergy status: Secondary | ICD-10-CM | POA: Insufficient documentation

## 2014-07-07 LAB — URINALYSIS, ROUTINE W REFLEX MICROSCOPIC
Bilirubin Urine: NEGATIVE
Glucose, UA: NEGATIVE mg/dL
Hgb urine dipstick: NEGATIVE
KETONES UR: NEGATIVE mg/dL
Nitrite: NEGATIVE
PH: 6 (ref 5.0–8.0)
Protein, ur: NEGATIVE mg/dL
Specific Gravity, Urine: 1.025 (ref 1.005–1.030)
Urobilinogen, UA: 2 mg/dL — ABNORMAL HIGH (ref 0.0–1.0)

## 2014-07-07 LAB — URINE MICROSCOPIC-ADD ON

## 2014-07-07 MED ORDER — NIFEDIPINE 10 MG PO CAPS
10.0000 mg | ORAL_CAPSULE | Freq: Once | ORAL | Status: AC
  Administered 2014-07-07: 10 mg via ORAL
  Filled 2014-07-07: qty 1

## 2014-07-07 NOTE — MAU Note (Signed)
Urine in lab 

## 2014-07-07 NOTE — MAU Provider Note (Signed)
Thana FarrMakhaila Carrol is a 24 y.o. G1P0 at 34.1 weeks came from th office with BPP 6/8 and NR NST.    History     There are no active problems to display for this patient.   Chief Complaint  Patient presents with  . Contractions   HPI  OB History    Gravida Para Term Preterm AB TAB SAB Ectopic Multiple Living   1         0      Past Medical History  Diagnosis Date  . Asthma     Past Surgical History  Procedure Laterality Date  . No past surgeries      Family History  Problem Relation Age of Onset  . Hypertension Mother     History  Substance Use Topics  . Smoking status: Never Smoker   . Smokeless tobacco: Not on file  . Alcohol Use: No     Comment: occ    Allergies:  Allergies  Allergen Reactions  . Latex Rash    Prescriptions prior to admission  Medication Sig Dispense Refill Last Dose  . ferrous sulfate 325 (65 FE) MG tablet Take 325 mg by mouth daily with breakfast.   07/06/2014 at Unknown time  . Prenatal Vit-Fe Fumarate-FA (PRENATAL MULTIVITAMIN) TABS tablet Take 1 tablet by mouth daily at 12 noon.   07/06/2014 at Unknown time  . albuterol (PROVENTIL HFA;VENTOLIN HFA) 108 (90 BASE) MCG/ACT inhaler Inhale 1-2 puffs into the lungs every 6 (six) hours as needed for wheezing or shortness of breath.   rescue  . albuterol (PROVENTIL) (2.5 MG/3ML) 0.083% nebulizer solution Take 2.5 mg by nebulization every 6 (six) hours as needed for wheezing or shortness of breath.   rescue  . meloxicam (MOBIC) 7.5 MG tablet Take 1 tablet (7.5 mg total) by mouth daily. (Patient not taking: Reported on 07/07/2014) 20 tablet 0     ROS See HPI above, all other systems are negative  Physical Exam   Blood pressure 122/80, pulse 103, temperature 98 F (36.7 C), temperature source Oral, resp. rate 18, height 5\' 3"  (1.6 m), weight 181 lb (82.101 kg), last menstrual period 10/20/2013.  Physical Exam Ext:  WNL ABD: Soft, non tender to palpation, no rebound or guarding SVE:  FP/T/H   ED Course  Assessment: IUP at  34.1weeks Membranes: intact FHR: Category 1 CTX:  SP 2-3 minutes moderate, currently 3-4 mild   Plan: Procardia Consult with Dr. Maureen Ralphsillard   Rea Kalama, CNM, MSN 07/07/2014. 3:58 PM

## 2014-07-07 NOTE — Discharge Instructions (Signed)
Braxton Hicks Contractions °Contractions of the uterus can occur throughout pregnancy. Contractions are not always a sign that you are in labor.  °WHAT ARE BRAXTON HICKS CONTRACTIONS?  °Contractions that occur before labor are called Braxton Hicks contractions, or false labor. Toward the end of pregnancy (32-34 weeks), these contractions can develop more often and may become more forceful. This is not true labor because these contractions do not result in opening (dilatation) and thinning of the cervix. They are sometimes difficult to tell apart from true labor because these contractions can be forceful and people have different pain tolerances. You should not feel embarrassed if you go to the hospital with false labor. Sometimes, the only way to tell if you are in true labor is for your health care provider to look for changes in the cervix. °If there are no prenatal problems or other health problems associated with the pregnancy, it is completely safe to be sent home with false labor and await the onset of true labor. °HOW CAN YOU TELL THE DIFFERENCE BETWEEN TRUE AND FALSE LABOR? °False Labor °· The contractions of false labor are usually shorter and not as hard as those of true labor.   °· The contractions are usually irregular.   °· The contractions are often felt in the front of the lower abdomen and in the groin.   °· The contractions may go away when you walk around or change positions while lying down.   °· The contractions get weaker and are shorter lasting as time goes on.   °· The contractions do not usually become progressively stronger, regular, and closer together as with true labor.   °True Labor °· Contractions in true labor last 30-70 seconds, become very regular, usually become more intense, and increase in frequency.   °· The contractions do not go away with walking.   °· The discomfort is usually felt in the top of the uterus and spreads to the lower abdomen and low back.   °· True labor can be  determined by your health care provider with an exam. This will show that the cervix is dilating and getting thinner.   °WHAT TO REMEMBER °· Keep up with your usual exercises and follow other instructions given by your health care provider.   °· Take medicines as directed by your health care provider.   °· Keep your regular prenatal appointments.   °· Eat and drink lightly if you think you are going into labor.   °· If Braxton Hicks contractions are making you uncomfortable:   °¨ Change your position from lying down or resting to walking, or from walking to resting.   °¨ Sit and rest in a tub of warm water.   °¨ Drink 2-3 glasses of water. Dehydration may cause these contractions.   °¨ Do slow and deep breathing several times an hour.   °WHEN SHOULD I SEEK IMMEDIATE MEDICAL CARE? °Seek immediate medical care if: °· Your contractions become stronger, more regular, and closer together.   °· You have fluid leaking or gushing from your vagina.   °· You have a fever.   °· You pass blood-tinged mucus.   °· You have vaginal bleeding.   °· You have continuous abdominal pain.   °· You have low back pain that you never had before.   °· You feel your baby's head pushing down and causing pelvic pressure.   °· Your baby is not moving as much as it used to.   °Document Released: 01/21/2005 Document Revised: 01/26/2013 Document Reviewed: 11/02/2012 °Exit[employer]® Patient Information ©2015 [employer]. This information is not intended to replace advice given to you by your health care   provider. Make sure you discuss any questions you have with your health care provider. ° °

## 2014-07-07 NOTE — MAU Note (Signed)
Sent from office, while on monitor- contractions noted.  Feels like a period.

## 2014-07-10 NOTE — MAU Provider Note (Signed)
Assumed care of this 24 yo G1P0 @ 34.1 wks who was sent over from the office for prolonged monitoring due to BPP 6/8 and regular, (unfelt) contractions, q 2 min.  NST reactive and contractions resolved w/ Procardia. Per previous team, plan was to d/c pt, give strict PTL precautions w/ office f/u on Monday. Plan was not to send pt home w/ Procardia or any other medication.  Took some time to talk w/ pt and mother about pt decreasing her level of activity. Advised to continue to push fluids and rest as much as possible.  Pt to call with questions or concerns.   Sherre ScarletKimberly Joslin Doell, CNM 07/07/14, 08:30 PM

## 2014-07-23 ENCOUNTER — Encounter (HOSPITAL_COMMUNITY): Payer: Self-pay | Admitting: *Deleted

## 2014-07-23 ENCOUNTER — Inpatient Hospital Stay (HOSPITAL_COMMUNITY)
Admission: AD | Admit: 2014-07-23 | Discharge: 2014-07-23 | Disposition: A | Discharge status: Home / Self Care | Payer: BLUE CROSS/BLUE SHIELD | Source: Ambulatory Visit | Attending: Obstetrics and Gynecology | Admitting: Obstetrics and Gynecology

## 2014-07-23 DIAGNOSIS — Z3A36 36 weeks gestation of pregnancy: Secondary | ICD-10-CM | POA: Diagnosis not present

## 2014-07-23 DIAGNOSIS — O479 False labor, unspecified: Secondary | ICD-10-CM

## 2014-07-23 LAB — URINALYSIS, ROUTINE W REFLEX MICROSCOPIC
BILIRUBIN URINE: NEGATIVE
GLUCOSE, UA: NEGATIVE mg/dL
HGB URINE DIPSTICK: NEGATIVE
Ketones, ur: NEGATIVE mg/dL
Nitrite: NEGATIVE
PH: 7 (ref 5.0–8.0)
Protein, ur: NEGATIVE mg/dL
Specific Gravity, Urine: 1.01 (ref 1.005–1.030)
Urobilinogen, UA: 0.2 mg/dL (ref 0.0–1.0)

## 2014-07-23 LAB — WET PREP, GENITAL
Clue Cells Wet Prep HPF POC: NONE SEEN
TRICH WET PREP: NONE SEEN
YEAST WET PREP: NONE SEEN

## 2014-07-23 LAB — URINE MICROSCOPIC-ADD ON

## 2014-07-23 LAB — POCT FERN TEST: POCT Fern Test: NEGATIVE

## 2014-07-23 NOTE — Discharge Instructions (Signed)
Third Trimester of Pregnancy The third trimester is from week 29 through week 42, months 7 through 9. The third trimester is a time when the fetus is growing rapidly. At the end of the ninth month, the fetus is about 20 inches in length and weighs 6-10 pounds.  BODY CHANGES Your body goes through many changes during pregnancy. The changes vary from woman to woman.   Your weight will continue to increase. You can expect to gain 25-35 pounds (11-16 kg) by the end of the pregnancy.  You may begin to get stretch marks on your hips, abdomen, and breasts.  You may urinate more often because the fetus is moving lower into your pelvis and pressing on your bladder.  You may develop or continue to have heartburn as a result of your pregnancy.  You may develop constipation because certain hormones are causing the muscles that push waste through your intestines to slow down.  You may develop hemorrhoids or swollen, bulging veins (varicose veins).  You may have pelvic pain because of the weight gain and pregnancy hormones relaxing your joints between the bones in your pelvis. Backaches may result from overexertion of the muscles supporting your posture.  You may have changes in your hair. These can include thickening of your hair, rapid growth, and changes in texture. Some women also have hair loss during or after pregnancy, or hair that feels dry or thin. Your hair will most likely return to normal after your baby is born.  Your breasts will continue to grow and be tender. A yellow discharge may leak from your breasts called colostrum.  Your belly button may stick out.  You may feel short of breath because of your expanding uterus.  You may notice the fetus "dropping," or moving lower in your abdomen.  You may have a bloody mucus discharge. This usually occurs a few days to a week before labor begins.  Your cervix becomes thin and soft (effaced) near your due date. WHAT TO EXPECT AT YOUR PRENATAL  EXAMS  You will have prenatal exams every 2 weeks until week 36. Then, you will have weekly prenatal exams. During a routine prenatal visit:  You will be weighed to make sure you and the fetus are growing normally.  Your blood pressure is taken.  Your abdomen will be measured to track your baby's growth.  The fetal heartbeat will be listened to.  Any test results from the previous visit will be discussed.  You may have a cervical check near your due date to see if you have effaced. At around 36 weeks, your caregiver will check your cervix. At the same time, your caregiver will also perform a test on the secretions of the vaginal tissue. This test is to determine if a type of bacteria, Group B streptococcus, is present. Your caregiver will explain this further. Your caregiver may ask you:  What your birth plan is.  How you are feeling.  If you are feeling the baby move.  If you have had any abnormal symptoms, such as leaking fluid, bleeding, severe headaches, or abdominal cramping.  If you have any questions. Other tests or screenings that may be performed during your third trimester include:  Blood tests that check for low iron levels (anemia).  Fetal testing to check the health, activity level, and growth of the fetus. Testing is done if you have certain medical conditions or if there are problems during the pregnancy. FALSE LABOR You may feel small, irregular contractions that   eventually go away. These are called Braxton Hicks contractions, or false labor. Contractions may last for hours, days, or even weeks before true labor sets in. If contractions come at regular intervals, intensify, or become painful, it is best to be seen by your caregiver.  SIGNS OF LABOR   Menstrual-like cramps.  Contractions that are 5 minutes apart or less.  Contractions that start on the top of the uterus and spread down to the lower abdomen and back.  A sense of increased pelvic pressure or back  pain.  A watery or bloody mucus discharge that comes from the vagina. If you have any of these signs before the 37th week of pregnancy, call your caregiver right away. You need to go to the hospital to get checked immediately. HOME CARE INSTRUCTIONS   Avoid all smoking, herbs, alcohol, and unprescribed drugs. These chemicals affect the formation and growth of the baby.  Follow your caregiver's instructions regarding medicine use. There are medicines that are either safe or unsafe to take during pregnancy.  Exercise only as directed by your caregiver. Experiencing uterine cramps is a good sign to stop exercising.  Continue to eat regular, healthy meals.  Wear a good support bra for breast tenderness.  Do not use hot tubs, steam rooms, or saunas.  Wear your seat belt at all times when driving.  Avoid raw meat, uncooked cheese, cat litter boxes, and soil used by cats. These carry germs that can cause birth defects in the baby.  Take your prenatal vitamins.  Try taking a stool softener (if your caregiver approves) if you develop constipation. Eat more high-fiber foods, such as fresh vegetables or fruit and whole grains. Drink plenty of fluids to keep your urine clear or pale yellow.  Take warm sitz baths to soothe any pain or discomfort caused by hemorrhoids. Use hemorrhoid cream if your caregiver approves.  If you develop varicose veins, wear support hose. Elevate your feet for 15 minutes, 3-4 times a day. Limit salt in your diet.  Avoid heavy lifting, wear low heal shoes, and practice good posture.  Rest a lot with your legs elevated if you have leg cramps or low back pain.  Visit your dentist if you have not gone during your pregnancy. Use a soft toothbrush to brush your teeth and be gentle when you floss.  A sexual relationship may be continued unless your caregiver directs you otherwise.  Do not travel far distances unless it is absolutely necessary and only with the approval  of your caregiver.  Take prenatal classes to understand, practice, and ask questions about the labor and delivery.  Make a trial run to the hospital.  Pack your hospital bag.  Prepare the baby's nursery.  Continue to go to all your prenatal visits as directed by your caregiver. SEEK MEDICAL CARE IF:  You are unsure if you are in labor or if your water has broken.  You have dizziness.  You have mild pelvic cramps, pelvic pressure, or nagging pain in your abdominal area.  You have persistent nausea, vomiting, or diarrhea.  You have a bad smelling vaginal discharge.  You have pain with urination. SEEK IMMEDIATE MEDICAL CARE IF:   You have a fever.  You are leaking fluid from your vagina.  You have spotting or bleeding from your vagina.  You have severe abdominal cramping or pain.  You have rapid weight loss or gain.  You have shortness of breath with chest pain.  You notice sudden or extreme swelling   of your face, hands, ankles, feet, or legs.  You have not felt your baby move in over an hour.  You have severe headaches that do not go away with medicine.  You have vision changes. Document Released: 01/15/2001 Document Revised: 01/26/2013 Document Reviewed: 03/24/2012 [employer] Patient Information 2015 [employer]. This information is not intended to replace advice given to you by your health care provider. Make sure you discuss any questions you have with your health care provider.  

## 2014-07-23 NOTE — MAU Provider Note (Signed)
History    Melody Farley is a 23y.o. G1P0 at 36.3wks who presents, unannounced, for contractions.  Patient states contractions started 30 minutes prior to arrival.  Patient denies LOF, VB, and reports good fetal movement.  Patient denies change in vaginal discharge and recent sexual intercourse. No issues with bowel movements or urination.   There are no active problems to display for this patient.   Chief Complaint  Patient presents with  . Contractions   HPI  OB History    Gravida Para Term Preterm AB TAB SAB Ectopic Multiple Living   1         0      Past Medical History  Diagnosis Date  . Asthma     Past Surgical History  Procedure Laterality Date  . No past surgeries      Family History  Problem Relation Age of Onset  . Hypertension Mother     History  Substance Use Topics  . Smoking status: Never Smoker   . Smokeless tobacco: Not on file  . Alcohol Use: No     Comment: occ    Allergies:  Allergies  Allergen Reactions  . Latex Rash    Prescriptions prior to admission  Medication Sig Dispense Refill Last Dose  . albuterol (PROVENTIL HFA;VENTOLIN HFA) 108 (90 BASE) MCG/ACT inhaler Inhale 1-2 puffs into the lungs every 6 (six) hours as needed for wheezing or shortness of breath.   Past Month at Unknown time  . albuterol (PROVENTIL) (2.5 MG/3ML) 0.083% nebulizer solution Take 2.5 mg by nebulization every 6 (six) hours as needed for wheezing or shortness of breath.   Past Month at Unknown time  . ferrous sulfate 325 (65 FE) MG tablet Take 325 mg by mouth daily with breakfast.   Past Week at Unknown time  . Prenatal Vit-Fe Fumarate-FA (PRENATAL MULTIVITAMIN) TABS tablet Take 1 tablet by mouth daily at 12 noon.   07/22/2014 at Unknown time    ROS  See HPI Above Physical Exam   Blood pressure 136/89, pulse 91, temperature 98.2 F (36.8 C), resp. rate 20, height  (1.626 m), weight 83.371 kg (183 lb 12.8 oz), last menstrual period 10/20/2013.  Results  for orders placed or performed during the hospital encounter of 07/23/14 (from the past 24 hour(s))  Urinalysis, Routine w reflex microscopic (not at [facility])     Status: Abnormal   Collection Time: 07/23/14  3:10 AM  Result Value Ref Range   Color, Urine YELLOW YELLOW   APPearance CLEAR CLEAR   Specific Gravity, Urine 1.010 1.005 - 1.030   pH 7.0 5.0 - 8.0   Glucose, UA NEGATIVE NEGATIVE mg/dL   Hgb urine dipstick NEGATIVE NEGATIVE   Bilirubin Urine NEGATIVE NEGATIVE   Ketones, ur NEGATIVE NEGATIVE mg/dL   Protein, ur NEGATIVE NEGATIVE mg/dL   Urobilinogen, UA 0.2 0.0 - 1.0 mg/dL   Nitrite NEGATIVE NEGATIVE   Leukocytes, UA TRACE (A) NEGATIVE  Urine microscopic-add on     Status: Abnormal   Collection Time: 07/23/14  3:10 AM  Result Value Ref Range   Squamous Epithelial / LPF FEW (A) RARE   WBC, UA 0-2 <3 WBC/hpf   Bacteria, UA FEW (A) RARE    Physical Exam SVE: 0/Thick/Ballotable Some clear fluid noted at introitus, sample collected for fern  FHR:145 bpm, Mod Var, -Decels, +Accels UC: Q1-71minutes, palpates mild  ED Course  Assessment: IUP at 36.3wks Cat I FT Contractions  Plan: -Labs: UA-WNL, Wet Prep -Fern Negative -Will await  wet prep results  Follow Up (0881) -Wet Prep-Negative -Offered medications, patient declined  -Labor Precautions -Keep appt as scheduled: 07/25/2014 -Encouraged to call if any questions or concerns arise prior to next scheduled office visit  -Discharged to home in stable condition  Laurisa Sahakian LYNN CNM, MSN 07/23/2014 4:10 AM

## 2014-07-23 NOTE — MAU Note (Signed)
Contractions started about 0215. Denies LOF or bleeding

## 2014-08-17 ENCOUNTER — Encounter (HOSPITAL_COMMUNITY): Payer: Self-pay | Admitting: *Deleted

## 2014-08-17 ENCOUNTER — Inpatient Hospital Stay (HOSPITAL_COMMUNITY)
Admission: AD | Admit: 2014-08-17 | Discharge: 2014-08-17 | Disposition: A | Discharge status: Home / Self Care | Payer: BLUE CROSS/BLUE SHIELD | Source: Ambulatory Visit | Attending: Obstetrics and Gynecology | Admitting: Obstetrics and Gynecology

## 2014-08-17 DIAGNOSIS — Z3A40 40 weeks gestation of pregnancy: Secondary | ICD-10-CM | POA: Insufficient documentation

## 2014-08-17 DIAGNOSIS — Z3483 Encounter for supervision of other normal pregnancy, third trimester: Secondary | ICD-10-CM

## 2014-08-17 HISTORY — DX: Unspecified ovarian cyst, unspecified side: N83.209

## 2014-08-17 NOTE — MAU Provider Note (Signed)
Melody Farley is a 24 y.o. G1P0 at 40.0 weeks present to MAU c/o ctx since last night.  She denies vb or lof w/+FM.  She was in the office yesterday for ROB VE 1cm.  Pt desires a water birth.   History     There are no active problems to display for this patient.   Chief Complaint  Patient presents with  . Labor Eval   HPI  OB History    Gravida Para Term Preterm AB TAB SAB Ectopic Multiple Living   1         0      Past Medical History  Diagnosis Date  . Asthma     Past Surgical History  Procedure Laterality Date  . No past surgeries      Family History  Problem Relation Age of Onset  . Hypertension Mother     History  Substance Use Topics  . Smoking status: Never Smoker   . Smokeless tobacco: Not on file  . Alcohol Use: No     Comment: occ    Allergies:  Allergies  Allergen Reactions  . Latex Rash    Prescriptions prior to admission  Medication Sig Dispense Refill Last Dose  . albuterol (PROVENTIL HFA;VENTOLIN HFA) 108 (90 BASE) MCG/ACT inhaler Inhale 1-2 puffs into the lungs every 6 (six) hours as needed for wheezing or shortness of breath.   Past Month at Unknown time  . albuterol (PROVENTIL) (2.5 MG/3ML) 0.083% nebulizer solution Take 2.5 mg by nebulization every 6 (six) hours as needed for wheezing or shortness of breath.   Past Month at Unknown time  . ferrous sulfate 325 (65 FE) MG tablet Take 325 mg by mouth daily with breakfast.   Past Week at Unknown time  . Prenatal Vit-Fe Fumarate-FA (PRENATAL MULTIVITAMIN) TABS tablet Take 1 tablet by mouth daily at 12 noon.   07/22/2014 at Unknown time    ROS See HPI above, all other systems are negative  Physical Exam   Blood pressure 141/84, pulse 77, temperature 98.2 F (36.8 C), temperature source Oral, resp. rate 18, height 5\' 3"  (1.6 m), weight 192 lb 8 oz (87.317 kg), last menstrual period 10/20/2013.  Physical Exam Ext:  WNL ABD: Soft, non tender to palpation, no rebound or guarding SVE:  unchanged from yesterdays appointment, 1cm   ED Course  Assessment: IUP at  40.0 weeks Membranes: intact FHR: Category 1 CTX:  Irregular Normal early labor disussed   Plan: -Tylenol PRN -Discussed need to follow up in office on Tuesday -Bleeding and PTL Precautions -kick counts -Encouraged to call if any questions or concerns arise prior to next scheduled office visit.  -Discharged to home in stable condition    Lamel Mccarley, CNM, MSN 08/17/2014. 3:30 PM

## 2014-08-17 NOTE — MAU Note (Signed)
contracting every 2-3 min since last night, last 3 hrs they have gotten stronger, no bleeding or leaking. No problems with preg.

## 2014-08-17 NOTE — Discharge Instructions (Signed)
Fetal Movement Counts Patient Name: _____[name]_____________________________________________ Patient Due Date: ____________________ Performing a fetal movement count is highly recommended in high-risk pregnancies, but it is good for every pregnant woman to do. Your health care provider may ask you to start counting fetal movements at 28 weeks of the pregnancy. Fetal movements often increase:  After eating a full meal.  After physical activity.  After eating or drinking something sweet or cold.  At rest. Pay attention to when you feel the baby is most active. This will help you notice a pattern of your baby's sleep and wake cycles and what factors contribute to an increase in fetal movement. It is important to perform a fetal movement count at the same time each day when your baby is normally most active.  HOW TO COUNT FETAL MOVEMENTS 1. Find a quiet and comfortable area to sit or lie down on your left side. Lying on your left side provides the best blood and oxygen circulation to your baby. 2. Write down the day and time on a sheet of paper or in a journal. 3. Start counting kicks, flutters, swishes, rolls, or jabs in a 2-hour period. You should feel at least 10 movements within 2 hours. 4. If you do not feel 10 movements in 2 hours, wait 2-3 hours and count again. Look for a change in the pattern or not enough counts in 2 hours. SEEK MEDICAL CARE IF:  You feel less than 10 counts in 2 hours, tried twice.  There is no movement in over an hour.  The pattern is changing or taking longer each day to reach 10 counts in 2 hours.  You feel the baby is not moving as he or she usually does. Date: ____________ Movements: ____________ Start time: ____________ Doreatha Martin time: ____________  Date: ____________ Movements: ____________ Start time: ____________ Doreatha Martin time: ____________ Date: ____________ Movements: ____________ Start time: ____________ Doreatha Martin time: ____________ Date:  ____________ Movements: ____________ Start time: ____________ Doreatha Martin time: ____________ Date: ____________ Movements: ____________ Start time: ____________ Doreatha Martin time: ____________ Date: ____________ Movements: ____________ Start time: ____________ Doreatha Martin time: ____________ Date: ____________ Movements: ____________ Start time: ____________ Doreatha Martin time: ____________ Date: ____________ Movements: ____________ Start time: ____________ Doreatha Martin time: ____________  Date: ____________ Movements: ____________ Start time: ____________ Doreatha Martin time: ____________ Date: ____________ Movements: ____________ Start time: ____________ Doreatha Martin time: ____________ Date: ____________ Movements: ____________ Start time: ____________ Doreatha Martin time: ____________ Date: ____________ Movements: ____________ Start time: ____________ Doreatha Martin time: ____________ Date: ____________ Movements: ____________ Start time: ____________ Doreatha Martin time: ____________ Date: ____________ Movements: ____________ Start time: ____________ Doreatha Martin time: ____________ Date: ____________ Movements: ____________ Start time: ____________ Doreatha Martin time: ____________  Date: ____________ Movements: ____________ Start time: ____________ Doreatha Martin time: ____________ Date: ____________ Movements: ____________ Start time: ____________ Doreatha Martin time: ____________ Date: ____________ Movements: ____________ Start time: ____________ Doreatha Martin time: ____________ Date: ____________ Movements: ____________ Start time: ____________ Doreatha Martin time: ____________ Date: ____________ Movements: ____________ Start time: ____________ Doreatha Martin time: ____________ Date: ____________ Movements: ____________ Start time: ____________ Doreatha Martin time: ____________ Date: ____________ Movements: ____________ Start time: ____________ Doreatha Martin time: ____________  Date: ____________ Movements: ____________ Start time: ____________ Doreatha Martin time: ____________ Date: ____________ Movements: ____________ Start  time: ____________ Doreatha Martin time: ____________ Date: ____________ Movements: ____________ Start time: ____________ Doreatha Martin time: ____________ Date: ____________ Movements: ____________ Start time: ____________ Doreatha Martin time: ____________ Date: ____________ Movements: ____________ Start time: ____________ Doreatha Martin time: ____________ Date: ____________ Movements: ____________ Start time: ____________ Doreatha Martin time: ____________ Date: ____________ Movements: ____________ Start time: ____________ Doreatha Martin time: ____________  Date: ____________ Movements: ____________ Start time: ____________  Finish time: ____________ Date: ____________ Movements: ____________ Start time: ____________ Doreatha MartinFinish time: ____________ Date: ____________ Movements: ____________ Start time: ____________ Doreatha MartinFinish time: ____________ Date: ____________ Movements: ____________ Start time: ____________ Doreatha MartinFinish time: ____________ Date: ____________ Movements: ____________ Start time: ____________ Doreatha MartinFinish time: ____________ Date: ____________ Movements: ____________ Start time: ____________ Doreatha MartinFinish time: ____________ Date: ____________ Movements: ____________ Start time: ____________ Doreatha MartinFinish time: ____________  Date: ____________ Movements: ____________ Start time: ____________ Doreatha MartinFinish time: ____________ Date: ____________ Movements: ____________ Start time: ____________ Doreatha MartinFinish time: ____________ Date: ____________ Movements: ____________ Start time: ____________ Doreatha MartinFinish time: ____________  [facility] Patient Information 2015 [employer]. This information is not intended to replace advice given to you by your health care provider. Make sure you discuss any questions you have with your health care provider. Braxton Hicks Contractions Contractions of the uterus can occur throughout pregnancy. Contractions are not always a sign that you are in labor.  WHAT ARE BRAXTON HICKS CONTRACTIONS?  Contractions that occur before labor are called Braxton Hicks  contractions, or false labor. Toward the end of pregnancy (32-34 weeks), these contractions can develop more often and may become more forceful. This is not true labor because these contractions do not result in opening (dilatation) and thinning of the cervix. They are sometimes difficult to tell apart from true labor because these contractions can be forceful and people have different pain tolerances. You should not feel embarrassed if you go to the hospital with false labor. Sometimes, the only way to tell if you are in true labor is for your health care provider to look for changes in the cervix. If there are no prenatal problems or other health problems associated with the pregnancy, it is completely safe to be sent home with false labor and await the onset of true labor. HOW CAN YOU TELL THE DIFFERENCE BETWEEN TRUE AND FALSE LABOR? False Labor  The contractions of false labor are usually shorter and not as hard as those of true labor.   The contractions are usually irregular.   The contractions are often felt in the front of the lower abdomen and in the groin.   The contractions may go away when you walk around or change positions while lying down.   The contractions get weaker and are shorter lasting as time goes on.   The contractions do not usually become progressively stronger, regular, and closer together as with true labor.  True Labor 5. Contractions in true labor last 30-70 seconds, become very regular, usually become more intense, and increase in frequency.  6. The contractions do not go away with walking.  7. The discomfort is usually felt in the top of the uterus and spreads to the lower abdomen and low back.  8. True labor can be determined by your health care provider with an exam. This will show that the cervix is dilating and getting thinner.  WHAT TO REMEMBER  Keep up with your usual exercises and follow other instructions given by your health care provider.    Take medicines as directed by your health care provider.   Keep your regular prenatal appointments.   Eat and drink lightly if you think you are going into labor.   If Braxton Hicks contractions are making you uncomfortable:   Change your position from lying down or resting to walking, or from walking to resting.   Sit and rest in a tub of warm water.   Drink 2-3 glasses of water. Dehydration may cause these contractions.   Do slow and deep breathing several  times an hour.  WHEN SHOULD I SEEK IMMEDIATE MEDICAL CARE? Seek immediate medical care if:  Your contractions become stronger, more regular, and closer together.   You have fluid leaking or gushing from your vagina.   You have a fever.   You pass blood-tinged mucus.   You have vaginal bleeding.   You have continuous abdominal pain.   You have low back pain that you never had before.   You feel your baby's head pushing down and causing pelvic pressure.   Your baby is not moving as much as it used to.  Document Released: 01/21/2005 Document Revised: 01/26/2013 Document Reviewed: 11/02/2012 [facility] Patient Information 2015 [place], [place]. This information is not intended to replace advice given to you by your health care provider. Make sure you discuss any questions you have with your health care provider.

## 2014-08-17 NOTE — MAU Note (Signed)
Patient presents at [redacted] weeks gestation and sent by her midwife for labor check. Fetus active. Denies bleeding and states she is having "regular" discharge.

## 2014-08-21 ENCOUNTER — Inpatient Hospital Stay (HOSPITAL_COMMUNITY)
Admission: AD | Admit: 2014-08-21 | Discharge: 2014-08-21 | Disposition: A | Discharge status: Home / Self Care | Payer: BLUE CROSS/BLUE SHIELD | Source: Ambulatory Visit | Attending: Obstetrics and Gynecology | Admitting: Obstetrics and Gynecology

## 2014-08-21 ENCOUNTER — Inpatient Hospital Stay (HOSPITAL_COMMUNITY): Payer: BLUE CROSS/BLUE SHIELD

## 2014-08-21 ENCOUNTER — Encounter (HOSPITAL_COMMUNITY): Payer: Self-pay | Admitting: *Deleted

## 2014-08-21 DIAGNOSIS — O133 Gestational [pregnancy-induced] hypertension without significant proteinuria, third trimester: Secondary | ICD-10-CM | POA: Diagnosis present

## 2014-08-21 DIAGNOSIS — Z3A40 40 weeks gestation of pregnancy: Secondary | ICD-10-CM | POA: Insufficient documentation

## 2014-08-21 DIAGNOSIS — O288 Other abnormal findings on antenatal screening of mother: Secondary | ICD-10-CM | POA: Insufficient documentation

## 2014-08-21 DIAGNOSIS — O480 Post-term pregnancy: Secondary | ICD-10-CM | POA: Diagnosis present

## 2014-08-21 DIAGNOSIS — O9081 Anemia of the puerperium: Secondary | ICD-10-CM | POA: Diagnosis not present

## 2014-08-21 DIAGNOSIS — O403XX0 Polyhydramnios, third trimester, not applicable or unspecified: Secondary | ICD-10-CM | POA: Diagnosis present

## 2014-08-21 DIAGNOSIS — O3663X0 Maternal care for excessive fetal growth, third trimester, not applicable or unspecified: Principal | ICD-10-CM | POA: Diagnosis present

## 2014-08-21 DIAGNOSIS — D649 Anemia, unspecified: Secondary | ICD-10-CM | POA: Diagnosis not present

## 2014-08-21 DIAGNOSIS — Z8759 Personal history of other complications of pregnancy, childbirth and the puerperium: Secondary | ICD-10-CM

## 2014-08-21 DIAGNOSIS — Z3689 Encounter for other specified antenatal screening: Secondary | ICD-10-CM | POA: Insufficient documentation

## 2014-08-21 NOTE — MAU Provider Note (Signed)
Melody Farley is a 24 y.o. G1P0 at 40.4 weeks returned to MAU c/o stronger ctx.  She report loosing her mucus plug last night and has experienced blood show this morning.  She denies LOF w/+FM.  Pt desires a Systems developerwaterbirth.   History     There are no active problems to display for this patient.   Chief Complaint  Patient presents with  . Labor Eval   HPI  OB History    Gravida Para Term Preterm AB TAB SAB Ectopic Multiple Living   1 0        0      Past Medical History  Diagnosis Date  . Asthma   . Ovarian cyst     Past Surgical History  Procedure Laterality Date  . No past surgeries      Family History  Problem Relation Age of Onset  . Hypertension Mother   . Asthma Mother   . Anemia Mother   . Hypertension Maternal Grandmother   . Arthritis Maternal Grandmother   . Asthma Maternal Grandmother   . Hypothyroidism Maternal Grandmother     History  Substance Use Topics  . Smoking status: Never Smoker   . Smokeless tobacco: Never Used  . Alcohol Use: No     Comment: occ    Allergies:  Allergies  Allergen Reactions  . Latex Rash    Prescriptions prior to admission  Medication Sig Dispense Refill Last Dose  . Prenatal Vit-Fe Fumarate-FA (PRENATAL MULTIVITAMIN) TABS tablet Take 1 tablet by mouth daily at 12 noon.   08/21/2014 at Unknown time  . albuterol (PROVENTIL HFA;VENTOLIN HFA) 108 (90 BASE) MCG/ACT inhaler Inhale 1-2 puffs into the lungs every 6 (six) hours as needed for wheezing or shortness of breath.   rescue    ROS See HPI above, all other systems are negative  Physical Exam   Blood pressure 130/82, pulse 81, temperature 98.6 F (37 C), temperature source Oral, resp. rate 18, last menstrual period 10/20/2013.  Physical Exam Ext:  WNL ABD: Soft, non tender to palpation, no rebound or guarding SVE: unchanged from last exam 1cm   ED Course  Assessment: IUP at  40.4weeks Membranes: intact FHR: Category 2 CTX:  occassional   Plan: Pt  given the option to  1) stay in the room for one hour and be rechecked 2) walk and return in one hour to be rechecked 3) go home and return with more active labor after a category 1 strip  Pt elected to stay and be recheck in one hour   Venus Standard, CNM, MSN 08/21/2014. 4:26 PM   Addendum 1400 still Category 2, 140, moderate variability, occasional accel, no decel Pt to US for BPP, AFI and EFW   MAU Addendum Note  US results AFI >31 BPP 8/8 EFW 10lb 9oz  R/B of a Vaginal delivery vs CS  Plan: -Discussed need to follow up in office on Tuesday -Bleeding and labor Precautions given -kick counts instructions given -Encouraged to call if any questions or concerns arise prior to next scheduled office visit.  -Discharged to home in stable condition Consulted with Dr. Waymond Cerazan   Venus Standard, CNM, MSN 08/21/2014. 4:26 PM   ** ADDENDUM/POINT OF CLARIFICATION:  The FHT tracing was Category I as no decelerations were present.  The heart tones were non-reactive in that she did not demonstrate 15x15 accels.  Further antepartum testing was performed as mentioned above and were reassuring.  Prior to discharge, FHT was Cat. I with 15x15  accels.  Myna Hidalgo, DO [phone] (pager) [phone] (office)

## 2014-08-21 NOTE — MAU Note (Signed)
Given a gingerale po.

## 2014-08-21 NOTE — MAU Note (Signed)
Pt presents complaining of contractions every 3-4 minutes. Denies leaking of fluid and reports some bloody show. Reports good fetal movement.

## 2014-08-21 NOTE — Discharge Instructions (Signed)

## 2014-08-22 ENCOUNTER — Encounter (HOSPITAL_COMMUNITY)
Admission: AD | Disposition: A | Discharge status: Home / Self Care | Payer: Self-pay | Source: Ambulatory Visit | Attending: Obstetrics and Gynecology

## 2014-08-22 ENCOUNTER — Encounter (HOSPITAL_COMMUNITY): Payer: Self-pay

## 2014-08-22 ENCOUNTER — Inpatient Hospital Stay (HOSPITAL_COMMUNITY): Payer: BLUE CROSS/BLUE SHIELD | Admitting: Certified Registered Nurse Anesthetist

## 2014-08-22 ENCOUNTER — Inpatient Hospital Stay (HOSPITAL_COMMUNITY)
Admission: AD | Admit: 2014-08-22 | Discharge: 2014-08-24 | DRG: 765 | Disposition: A | Discharge status: Home / Self Care | Payer: BLUE CROSS/BLUE SHIELD | Source: Ambulatory Visit | Attending: Obstetrics and Gynecology | Admitting: Obstetrics and Gynecology

## 2014-08-22 LAB — PROTEIN / CREATININE RATIO, URINE: Creatinine, Urine: 54 mg/dL

## 2014-08-22 LAB — COMPREHENSIVE METABOLIC PANEL
ALT: 21 U/L (ref 14–54)
AST: 28 U/L (ref 15–41)
Albumin: 2.9 g/dL — ABNORMAL LOW (ref 3.5–5.0)
Alkaline Phosphatase: 237 U/L — ABNORMAL HIGH (ref 38–126)
Anion gap: 4 — ABNORMAL LOW (ref 5–15)
BILIRUBIN TOTAL: 0.6 mg/dL (ref 0.3–1.2)
BUN: 10 mg/dL (ref 6–20)
CO2: 21 mmol/L — ABNORMAL LOW (ref 22–32)
Calcium: 8.4 mg/dL — ABNORMAL LOW (ref 8.9–10.3)
Chloride: 108 mmol/L (ref 101–111)
Creatinine, Ser: 0.73 mg/dL (ref 0.44–1.00)
GFR calc non Af Amer: >60 mL/min (ref >60)
Glucose, Bld: 86 mg/dL (ref 65–99)
Potassium: 3.7 mmol/L (ref 3.5–5.1)
SODIUM: 133 mmol/L — AB (ref 135–145)
TOTAL PROTEIN: 6.6 g/dL (ref 6.5–8.1)

## 2014-08-22 LAB — CBC
HCT: 33.8 % — ABNORMAL LOW (ref 36.0–46.0)
HEMOGLOBIN: 10.8 g/dL — AB (ref 12.0–15.0)
MCH: 26.7 pg (ref 26.0–34.0)
MCHC: 32 g/dL (ref 30.0–36.0)
MCV: 83.5 fL (ref 78.0–100.0)
Platelets: 141 10*3/uL — ABNORMAL LOW (ref 150–400)
RBC: 4.05 MIL/uL (ref 3.87–5.11)
RDW: 13.6 % (ref 11.5–15.5)
WBC: 8.7 10*3/uL (ref 4.0–10.5)

## 2014-08-22 LAB — TYPE AND SCREEN
ABO/RH(D): B POS
Antibody Screen: NEGATIVE

## 2014-08-22 LAB — ABO/RH: ABO/RH(D): B POS

## 2014-08-22 SURGERY — Surgical Case
Anesthesia: Spinal

## 2014-08-22 MED ORDER — MORPHINE SULFATE (PF) 0.5 MG/ML IJ SOLN
INTRAMUSCULAR | Status: DC | PRN
  Administered 2014-08-22: .2 mg via INTRATHECAL

## 2014-08-22 MED ORDER — HYDROMORPHONE HCL 1 MG/ML IJ SOLN: 0.2500 mg | INTRAMUSCULAR | Status: DC | PRN

## 2014-08-22 MED ORDER — KETOROLAC TROMETHAMINE 30 MG/ML IJ SOLN
30.0000 mg | Freq: Four times a day (QID) | INTRAMUSCULAR | Status: AC | PRN
Start: 2014-08-22 — End: 2014-08-23

## 2014-08-22 MED ORDER — LACTATED RINGERS IV SOLN
INTRAVENOUS | Status: DC
  Administered 2014-08-22 – 2014-08-23 (×4): via INTRAVENOUS

## 2014-08-22 MED ORDER — NALBUPHINE HCL 10 MG/ML IJ SOLN: 5.0000 mg | INTRAMUSCULAR | Status: DC | PRN

## 2014-08-22 MED ORDER — OXYTOCIN 10 UNIT/ML IJ SOLN
INTRAMUSCULAR | Status: AC
  Filled 2014-08-22: qty 4

## 2014-08-22 MED ORDER — ZOLPIDEM TARTRATE 5 MG PO TABS: 5.0000 mg | ORAL_TABLET | Freq: Every evening | ORAL | Status: DC | PRN

## 2014-08-22 MED ORDER — MENTHOL 3 MG MT LOZG: 1.0000 | LOZENGE | OROMUCOSAL | Status: DC | PRN

## 2014-08-22 MED ORDER — CITRIC ACID-SODIUM CITRATE 334-500 MG/5ML PO SOLN
30.0000 mL | Freq: Once | ORAL | Status: AC
  Administered 2014-08-22: 30 mL via ORAL
  Filled 2014-08-22: qty 15

## 2014-08-22 MED ORDER — LANOLIN HYDROUS EX OINT: 1.0000 "application " | TOPICAL_OINTMENT | CUTANEOUS | Status: DC | PRN

## 2014-08-22 MED ORDER — SCOPOLAMINE 1 MG/3DAYS TD PT72
MEDICATED_PATCH | TRANSDERMAL | Status: AC
  Filled 2014-08-22: qty 1

## 2014-08-22 MED ORDER — PRENATAL MULTIVITAMIN CH
1.0000 | ORAL_TABLET | Freq: Every day | ORAL | Status: DC
  Administered 2014-08-23 – 2014-08-24 (×2): 1 via ORAL
  Filled 2014-08-22 (×2): qty 1

## 2014-08-22 MED ORDER — SIMETHICONE 80 MG PO CHEW
80.0000 mg | CHEWABLE_TABLET | Freq: Three times a day (TID) | ORAL | Status: DC
  Administered 2014-08-23 – 2014-08-24 (×4): 80 mg via ORAL
  Filled 2014-08-22 (×3): qty 1

## 2014-08-22 MED ORDER — METHYLERGONOVINE MALEATE 0.2 MG/ML IJ SOLN: 0.2000 mg | INTRAMUSCULAR | Status: DC | PRN

## 2014-08-22 MED ORDER — LACTATED RINGERS IV BOLUS (SEPSIS)
1000.0000 mL | Freq: Once | INTRAVENOUS | Status: AC
  Administered 2014-08-22: 1000 mL via INTRAVENOUS

## 2014-08-22 MED ORDER — CEFAZOLIN SODIUM-DEXTROSE 2-3 GM-% IV SOLR
2.0000 g | INTRAVENOUS | Status: AC
  Administered 2014-08-22: 2 g via INTRAVENOUS
  Filled 2014-08-22: qty 50

## 2014-08-22 MED ORDER — SIMETHICONE 80 MG PO CHEW: 80.0000 mg | CHEWABLE_TABLET | ORAL | Status: DC | PRN

## 2014-08-22 MED ORDER — FENTANYL CITRATE (PF) 100 MCG/2ML IJ SOLN
INTRAMUSCULAR | Status: AC
  Filled 2014-08-22: qty 2

## 2014-08-22 MED ORDER — MORPHINE SULFATE 0.5 MG/ML IJ SOLN
INTRAMUSCULAR | Status: AC
  Filled 2014-08-22: qty 10

## 2014-08-22 MED ORDER — METHYLERGONOVINE MALEATE 0.2 MG PO TABS: 0.2000 mg | ORAL_TABLET | ORAL | Status: DC | PRN

## 2014-08-22 MED ORDER — KETOROLAC TROMETHAMINE 30 MG/ML IJ SOLN
INTRAMUSCULAR | Status: AC
  Filled 2014-08-22: qty 1

## 2014-08-22 MED ORDER — IBUPROFEN 600 MG PO TABS: 600.0000 mg | ORAL_TABLET | Freq: Four times a day (QID) | ORAL | Status: DC | PRN

## 2014-08-22 MED ORDER — OXYTOCIN 40 UNITS IN LACTATED RINGERS INFUSION - SIMPLE MED: 62.5000 mL/h | INTRAVENOUS | Status: AC

## 2014-08-22 MED ORDER — FAMOTIDINE IN NACL 20-0.9 MG/50ML-% IV SOLN
20.0000 mg | Freq: Once | INTRAVENOUS | Status: AC
  Administered 2014-08-22: 20 mg via INTRAVENOUS
  Filled 2014-08-22: qty 50

## 2014-08-22 MED ORDER — DIPHENHYDRAMINE HCL 25 MG PO CAPS
25.0000 mg | ORAL_CAPSULE | Freq: Four times a day (QID) | ORAL | Status: DC | PRN
  Administered 2014-08-23: 25 mg via ORAL
  Filled 2014-08-22: qty 1

## 2014-08-22 MED ORDER — METOCLOPRAMIDE HCL 5 MG/ML IJ SOLN
INTRAMUSCULAR | Status: DC | PRN
  Administered 2014-08-22: 10 mg via INTRAVENOUS

## 2014-08-22 MED ORDER — ONDANSETRON HCL 4 MG/2ML IJ SOLN
4.0000 mg | Freq: Once | INTRAMUSCULAR | Status: AC
  Administered 2014-08-22: 4 mg via INTRAVENOUS
  Filled 2014-08-22: qty 2

## 2014-08-22 MED ORDER — SODIUM CHLORIDE 0.9 % IJ SOLN: 3.0000 mL | INTRAMUSCULAR | Status: DC | PRN

## 2014-08-22 MED ORDER — OXYCODONE-ACETAMINOPHEN 5-325 MG PO TABS
1.0000 | ORAL_TABLET | ORAL | Status: DC | PRN
  Administered 2014-08-23 – 2014-08-24 (×4): 1 via ORAL
  Filled 2014-08-22 (×4): qty 1

## 2014-08-22 MED ORDER — IBUPROFEN 600 MG PO TABS
600.0000 mg | ORAL_TABLET | Freq: Four times a day (QID) | ORAL | Status: DC
  Administered 2014-08-23 – 2014-08-24 (×7): 600 mg via ORAL
  Filled 2014-08-22 (×8): qty 1

## 2014-08-22 MED ORDER — NALBUPHINE HCL 10 MG/ML IJ SOLN: 5.0000 mg | Freq: Once | INTRAMUSCULAR | Status: AC | PRN

## 2014-08-22 MED ORDER — ONDANSETRON HCL 4 MG/2ML IJ SOLN: 4.0000 mg | Freq: Three times a day (TID) | INTRAMUSCULAR | Status: DC | PRN

## 2014-08-22 MED ORDER — MEASLES, MUMPS & RUBELLA VAC ~~LOC~~ INJ: 0.5000 mL | INJECTION | Freq: Once | SUBCUTANEOUS | Status: DC

## 2014-08-22 MED ORDER — PHENYLEPHRINE HCL 10 MG/ML IJ SOLN
INTRAMUSCULAR | Status: DC | PRN
  Administered 2014-08-22 (×2): 80 ug via INTRAVENOUS

## 2014-08-22 MED ORDER — DIPHENHYDRAMINE HCL 25 MG PO CAPS
25.0000 mg | ORAL_CAPSULE | ORAL | Status: DC | PRN
  Administered 2014-08-23 (×2): 25 mg via ORAL
  Filled 2014-08-22 (×2): qty 1

## 2014-08-22 MED ORDER — MEPERIDINE HCL 25 MG/ML IJ SOLN: 6.2500 mg | INTRAMUSCULAR | Status: DC | PRN

## 2014-08-22 MED ORDER — NALOXONE HCL 0.4 MG/ML IJ SOLN: 0.4000 mg | INTRAMUSCULAR | Status: DC | PRN

## 2014-08-22 MED ORDER — LACTATED RINGERS IV SOLN
INTRAVENOUS | Status: DC | PRN
  Administered 2014-08-22: 17:00:00 via INTRAVENOUS

## 2014-08-22 MED ORDER — BUPIVACAINE IN DEXTROSE 0.75-8.25 % IT SOLN
INTRATHECAL | Status: DC | PRN
  Administered 2014-08-22: 1.5 mL via INTRATHECAL

## 2014-08-22 MED ORDER — ACETAMINOPHEN 325 MG PO TABS
650.0000 mg | ORAL_TABLET | ORAL | Status: DC | PRN
Start: 2014-08-22 — End: 2014-08-24

## 2014-08-22 MED ORDER — SIMETHICONE 80 MG PO CHEW
80.0000 mg | CHEWABLE_TABLET | ORAL | Status: DC
  Administered 2014-08-24: 80 mg via ORAL
  Filled 2014-08-22: qty 1

## 2014-08-22 MED ORDER — SENNOSIDES-DOCUSATE SODIUM 8.6-50 MG PO TABS
2.0000 | ORAL_TABLET | ORAL | Status: DC
  Administered 2014-08-24: 2 via ORAL
  Filled 2014-08-22: qty 2

## 2014-08-22 MED ORDER — PHENYLEPHRINE 8 MG IN D5W 100 ML (0.08MG/ML) PREMIX OPTIME
INJECTION | INTRAVENOUS | Status: DC | PRN
  Administered 2014-08-22: 60 ug/min via INTRAVENOUS

## 2014-08-22 MED ORDER — DIPHENHYDRAMINE HCL 50 MG/ML IJ SOLN: 12.5000 mg | INTRAMUSCULAR | Status: DC | PRN

## 2014-08-22 MED ORDER — DEXAMETHASONE SODIUM PHOSPHATE 4 MG/ML IJ SOLN
INTRAMUSCULAR | Status: DC | PRN
  Administered 2014-08-22: 5 mg via INTRAVENOUS

## 2014-08-22 MED ORDER — DEXTROSE 5 % IV SOLN: 1.0000 ug/kg/h | INTRAVENOUS | Status: DC | PRN

## 2014-08-22 MED ORDER — METOCLOPRAMIDE HCL 5 MG/ML IJ SOLN
INTRAMUSCULAR | Status: AC
  Filled 2014-08-22: qty 2

## 2014-08-22 MED ORDER — KETOROLAC TROMETHAMINE 30 MG/ML IJ SOLN: 30.0000 mg | Freq: Once | INTRAMUSCULAR | Status: DC | PRN

## 2014-08-22 MED ORDER — TETANUS-DIPHTH-ACELL PERTUSSIS 5-2.5-18.5 LF-MCG/0.5 IM SUSP: 0.5000 mL | Freq: Once | INTRAMUSCULAR | Status: DC

## 2014-08-22 MED ORDER — SCOPOLAMINE 1 MG/3DAYS TD PT72: 1.0000 | MEDICATED_PATCH | Freq: Once | TRANSDERMAL | Status: DC

## 2014-08-22 MED ORDER — BUPIVACAINE HCL (PF) 0.25 % IJ SOLN
INTRAMUSCULAR | Status: AC
  Filled 2014-08-22: qty 30

## 2014-08-22 MED ORDER — ONDANSETRON HCL 4 MG/2ML IJ SOLN
INTRAMUSCULAR | Status: DC | PRN
  Administered 2014-08-22: 4 mg via INTRAVENOUS

## 2014-08-22 MED ORDER — ONDANSETRON HCL 4 MG/2ML IJ SOLN
INTRAMUSCULAR | Status: AC
  Filled 2014-08-22: qty 2

## 2014-08-22 MED ORDER — OXYCODONE-ACETAMINOPHEN 5-325 MG PO TABS: 2.0000 | ORAL_TABLET | ORAL | Status: DC | PRN

## 2014-08-22 MED ORDER — FENTANYL CITRATE (PF) 100 MCG/2ML IJ SOLN
100.0000 ug | Freq: Once | INTRAMUSCULAR | Status: AC
  Administered 2014-08-22: 100 ug via INTRAVENOUS
  Filled 2014-08-22: qty 2

## 2014-08-22 MED ORDER — PHENYLEPHRINE 8 MG IN D5W 100 ML (0.08MG/ML) PREMIX OPTIME
INJECTION | INTRAVENOUS | Status: AC
  Filled 2014-08-22: qty 100

## 2014-08-22 MED ORDER — OXYTOCIN 40 UNITS IN LACTATED RINGERS INFUSION - SIMPLE MED
INTRAVENOUS | Status: DC | PRN
  Administered 2014-08-22: 40 [IU] via INTRAVENOUS

## 2014-08-22 MED ORDER — PROMETHAZINE HCL 25 MG/ML IJ SOLN: 6.2500 mg | INTRAMUSCULAR | Status: DC | PRN

## 2014-08-22 MED ORDER — KETOROLAC TROMETHAMINE 30 MG/ML IJ SOLN
30.0000 mg | Freq: Four times a day (QID) | INTRAMUSCULAR | Status: AC | PRN
  Administered 2014-08-22: 30 mg via INTRAMUSCULAR

## 2014-08-22 MED ORDER — BUPIVACAINE HCL (PF) 0.25 % IJ SOLN
INTRAMUSCULAR | Status: DC | PRN
  Administered 2014-08-22: 20 mL

## 2014-08-22 MED ORDER — DEXAMETHASONE SODIUM PHOSPHATE 10 MG/ML IJ SOLN
INTRAMUSCULAR | Status: AC
  Filled 2014-08-22: qty 1

## 2014-08-22 MED ORDER — ALBUTEROL SULFATE (2.5 MG/3ML) 0.083% IN NEBU: 3.0000 mL | INHALATION_SOLUTION | Freq: Four times a day (QID) | RESPIRATORY_TRACT | Status: DC | PRN

## 2014-08-22 MED ORDER — FENTANYL CITRATE (PF) 100 MCG/2ML IJ SOLN
INTRAMUSCULAR | Status: DC | PRN
  Administered 2014-08-22: 12.5 ug via INTRATHECAL

## 2014-08-22 MED ORDER — DIBUCAINE 1 % RE OINT: 1.0000 "application " | TOPICAL_OINTMENT | RECTAL | Status: DC | PRN

## 2014-08-22 MED ORDER — WITCH HAZEL-GLYCERIN EX PADS: 1.0000 "application " | MEDICATED_PAD | CUTANEOUS | Status: DC | PRN

## 2014-08-22 MED ORDER — PHENYLEPHRINE 40 MCG/ML (10ML) SYRINGE FOR IV PUSH (FOR BLOOD PRESSURE SUPPORT)
PREFILLED_SYRINGE | INTRAVENOUS | Status: AC
  Filled 2014-08-22: qty 10

## 2014-08-22 MED ORDER — FERROUS SULFATE 325 (65 FE) MG PO TABS
325.0000 mg | ORAL_TABLET | Freq: Two times a day (BID) | ORAL | Status: DC
  Administered 2014-08-23 – 2014-08-24 (×2): 325 mg via ORAL
  Filled 2014-08-22 (×2): qty 1

## 2014-08-22 SURGICAL SUPPLY — 38 items
BENZOIN TINCTURE PRP APPL 2/3 (GAUZE/BANDAGES/DRESSINGS) ×3 IMPLANT
BOOTIES KNEE HIGH SLOAN (MISCELLANEOUS) ×6 IMPLANT
CLAMP CORD UMBIL (MISCELLANEOUS) IMPLANT
CLOSURE WOUND 1/2 X4 (GAUZE/BANDAGES/DRESSINGS) ×1
CLOSURE WOUND 1/4X4 (GAUZE/BANDAGES/DRESSINGS) ×1
CLOTH BEACON ORANGE TIMEOUT ST (SAFETY) ×3 IMPLANT
DRAIN JACKSON PRT FLT 10 (DRAIN) IMPLANT
DRAPE SHEET LG 3/4 BI-LAMINATE (DRAPES) IMPLANT
DRSG OPSITE POSTOP 4X10 (GAUZE/BANDAGES/DRESSINGS) ×3 IMPLANT
DURAPREP 26ML APPLICATOR (WOUND CARE) ×3 IMPLANT
ELECT REM PT RETURN 9FT ADLT (ELECTROSURGICAL) ×3
ELECTRODE REM PT RTRN 9FT ADLT (ELECTROSURGICAL) ×1 IMPLANT
EVACUATOR SILICONE 100CC (DRAIN) IMPLANT
EXTRACTOR VACUUM M CUP 4 TUBE (SUCTIONS) IMPLANT
EXTRACTOR VACUUM M CUP 4' TUBE (SUCTIONS)
GLOVE BIOGEL PI IND STRL 7.0 (GLOVE) ×1 IMPLANT
GLOVE BIOGEL PI INDICATOR 7.0 (GLOVE) ×2
GLOVE ECLIPSE 6.5 STRL STRAW (GLOVE) ×3 IMPLANT
GOWN STRL REUS W/TWL LRG LVL3 (GOWN DISPOSABLE) ×6 IMPLANT
KIT ABG SYR 3ML LUER SLIP (SYRINGE) IMPLANT
NEEDLE HYPO 22GX1.5 SAFETY (NEEDLE) ×3 IMPLANT
NEEDLE HYPO 25X5/8 SAFETYGLIDE (NEEDLE) IMPLANT
NS IRRIG 1000ML POUR BTL (IV SOLUTION) ×6 IMPLANT
PACK C SECTION WH (CUSTOM PROCEDURE TRAY) ×3 IMPLANT
PAD OB MATERNITY 4.3X12.25 (PERSONAL CARE ITEMS) ×3 IMPLANT
RTRCTR C-SECT PINK 25CM LRG (MISCELLANEOUS) ×3 IMPLANT
STRIP CLOSURE SKIN 1/2X4 (GAUZE/BANDAGES/DRESSINGS) ×2 IMPLANT
STRIP CLOSURE SKIN 1/4X4 (GAUZE/BANDAGES/DRESSINGS) ×2 IMPLANT
SUT MNCRL AB 3-0 PS2 27 (SUTURE) ×3 IMPLANT
SUT SILK 2 0 FSL 18 (SUTURE) IMPLANT
SUT VIC AB 0 CTX 36 (SUTURE) ×4
SUT VIC AB 0 CTX36XBRD ANBCTRL (SUTURE) ×2 IMPLANT
SUT VIC AB 1 CT1 36 (SUTURE) ×6 IMPLANT
SUT VIC AB 2-0 CT1 27 (SUTURE)
SUT VIC AB 2-0 CT1 TAPERPNT 27 (SUTURE) IMPLANT
SYR 20CC LL (SYRINGE) ×3 IMPLANT
TOWEL OR 17X24 6PK STRL BLUE (TOWEL DISPOSABLE) ×3 IMPLANT
TRAY FOLEY CATH SILVER 14FR (SET/KITS/TRAYS/PACK) ×3 IMPLANT

## 2014-08-22 NOTE — Progress Notes (Signed)
Patient reports drank too much too fast- states vomited small amount (not seen by RN). Is too early for medication so V. Standard CNM called- order received to give Zofran at 2230. Pt made aware and instructed not to drink anything until after Zofran given. Pt verbalized understanding

## 2014-08-22 NOTE — Transfer of Care (Signed)
Immediate Anesthesia Transfer of Care Note  Patient: Melody Farley  Procedure(s) Performed: Procedure(s): CESAREAN SECTION (N/A)  Patient Location: PACU  Anesthesia Type:Spinal  Level of Consciousness: awake, alert  and oriented  Airway & Oxygen Therapy: Patient Spontanous Breathing  Post-op Assessment: Report given to RN and Post -op Vital signs reviewed and stable  Post vital signs: Reviewed and stable  Last Vitals:  Filed Vitals:   08/22/14 1439  BP: 138/103  Pulse: 77  Temp:   Resp:     Complications: No apparent anesthesia complications

## 2014-08-22 NOTE — Lactation Note (Signed)
This note was copied from the chart of Melody Thana FarrMakhaila Peruski. Lactation Consultation Note New mom took BF classes and read a lot about BF. Holding baby cradle position BF baby had a shallow latch. Baby's nose buried in breast. repositioned baby to turn body more facing mom and adjust the head for better breathing and obtaining a deeper latch. Encouraged breast massage. Referred to Baby and Me Book in Breastfeeding section Pg. 22-23 for position options and Proper latch demonstration. Mom doing STS. Mom encouraged to feed baby 8-12 times/24 hours and with feeding cues. Mom encouraged to waken baby for feeds. Educated about newborn behavior, I&O, supply and demand. WH/LC brochure given w/resources, support groups and LC services. Patient Name: Melody Farley [id number]'s Date: 08/22/2014 Reason for consult: Initial assessment   Maternal Data Has patient been taught Hand Expression?: Yes Does the patient have breastfeeding experience prior to this delivery?: No  Feeding Feeding Type: Breast Fed Length of feed: 10 min  LATCH Score/Interventions Latch: Repeated attempts needed to sustain latch, nipple held in mouth throughout feeding, stimulation needed to elicit sucking reflex. Intervention(s): Assist with latch;Breast massage;Breast compression;Adjust position  Audible Swallowing: None Intervention(s): Skin to skin;Hand expression Intervention(s): Hand expression;Alternate breast massage  Type of Nipple: Everted at rest and after stimulation  Comfort (Breast/Nipple): Soft / non-tender     Hold (Positioning): Assistance needed to correctly position infant at breast and maintain latch. Intervention(s): Breastfeeding basics reviewed;Support Pillows;Position options;Skin to skin  LATCH Score: 6  Lactation Tools Discussed/Used     Consult Status Consult Status: Follow-up Date: 08/23/14 Follow-up type: In-patient    Charyl DancerCARVER, Melody Farley 08/22/2014, 11:57 PM

## 2014-08-22 NOTE — Anesthesia Preprocedure Evaluation (Signed)
Anesthesia Evaluation  Patient identified by MRN, date of birth, ID band Patient awake    Reviewed: Allergy & Precautions, H&P , NPO status , Patient's Chart, lab work & pertinent test results  Airway Mallampati: I  TM Distance: >3 FB Neck ROM: full    Dental no notable dental hx.    Pulmonary neg pulmonary ROS,    Pulmonary exam normal        Cardiovascular negative cardio ROS Normal cardiovascular exam     Neuro/Psych negative neurological ROS  negative psych ROS   GI/Hepatic negative GI ROS, Neg liver ROS,   Endo/Other  negative endocrine ROS  Renal/GU negative Renal ROS     Musculoskeletal   Abdominal Normal abdominal exam  (+)   Peds  Hematology negative hematology ROS (+)   Anesthesia Other Findings   Reproductive/Obstetrics (+) Pregnancy                             Anesthesia Physical Anesthesia Plan  ASA: II  Anesthesia Plan: Spinal   Post-op Pain Management:    Induction:   Airway Management Planned:   Additional Equipment:   Intra-op Plan:   Post-operative Plan:   Informed Consent: I have reviewed the patients History and Physical, chart, labs and discussed the procedure including the risks, benefits and alternatives for the proposed anesthesia with the patient or authorized representative who has indicated his/her understanding and acceptance.     Plan Discussed with: CRNA and Surgeon  Anesthesia Plan Comments:         Anesthesia Quick Evaluation  

## 2014-08-22 NOTE — H&P (Signed)
  Melody FarrMakhaila Melody Farley is a 24 y.o. female presenting for 40 + 5 weeks with contractions for more than 24 hours and requesting a primary cesarean section for macrosomia..  Pregnancy followed at [facility] since 21+3  weeks and remarkable for:  Late to care Polyhydramnios at 33 weeks LGA at 36 weeks with 94%: EFW 08/21/14 was 4788g or 10 lbs 9 oz with AFI of 31 1 hour glucola elevated at 137 with normal 3 hour GTT Elevation of BP today without symptoms of pre-eclampsia: labs all normal except platelets at 141 with PCR too low to report   OB History    Gravida Para Term Preterm AB TAB SAB Ectopic Multiple Living   1 0        0     Past Medical History  Diagnosis Date  . Asthma   . Ovarian cyst    Past Surgical History  Procedure Laterality Date  . No past surgeries      Family History:   family history includes Anemia in her mother; Arthritis in her maternal grandmother; Asthma in her maternal grandmother and mother; Hypertension in her maternal grandmother and mother; Hypothyroidism in her maternal grandmother. Social History:    reports that she has never smoked. She has never used smokeless tobacco. She reports that she does not drink alcohol or use illicit drugs.   Prenatal labs: ABO, Rh: B POS (07/18 1240) Antibody: NEG (07/18 1240) Rubella:  Immune RPR:   negative HBsAg:   negative HIV:   negative GBS:   negative   Prenatal Transfer Tool  Maternal Diabetes: No Genetic Screening: Declined Maternal Ultrasounds/Referrals: Abnormal:  Findings:   Other: macrosomia and polyhyydramnios Fetal Ultrasounds or other Referrals:  None Maternal Substance Abuse:  No Significant Maternal Medications:  None Significant Maternal Lab Results: None   Dilation: 2.5 Effacement (%): 100 Station: -3 Exam by:: S. Carrera, RNC Blood pressure 138/103, pulse 77, temperature 98.7 F (37.1 C), temperature source Oral, resp. rate 18, last menstrual period 10/20/2013.  General Appearance:  Alert, appropriate appearance for age. No acute distress HEENT Exam: Grossly normal Chest/Respiratory Exam: Normal chest wall and respirations. Clear to auscultation  Cardiovascular Exam: Regular rate and rhythm. S1, S2, no murmur Gastrointestinal Exam: soft, non-tender, Uterus gravid with size compatible with GA, Vertex presentation by Leopold's maneuvers Psychiatric Exam: Alert and oriented, appropriate affect  ++++++++++++++++++++++++++++++++++++++++++++++++++++++++++++++++  Fetal tracings: Category 1  ++++++++++++++++++++++++++++++++++++++++++++++++++++++++++++++++   Assessment/Plan:  SIUP at 40+5 weeks with early labor, macrosomia and polyhydramnios requesting a primary cesarean section.   Cesarean section reviewed with pt with R&B including but not limited to:  bleeding, infection, injury to other organs. Low transverse approach planned which will allow vaginal delivery with future pregnancies. Should a vertical incision or inverted T be needed, patient is aware that repeat cesarean sections would be recommended in the future. Expected hospital stay and recovery also discussed.    Silverio LaySandra Delmar Arriaga MD 08/22/2014, 4:09 PM

## 2014-08-22 NOTE — Progress Notes (Signed)
OR desk, anesthesia and AC notified of C/S

## 2014-08-22 NOTE — Anesthesia Procedure Notes (Signed)
Spinal Patient location during procedure: OR Start time: 08/22/2014 4:34 PM End time: 08/22/2014 4:37 PM Staffing Anesthesiologist: Leilani AbleHATCHETT, Miquel Lamson Performed by: anesthesiologist  Preanesthetic Checklist Completed: patient identified, surgical consent, pre-op evaluation, timeout performed, IV checked, risks and benefits discussed and monitors and equipment checked Spinal Block Patient position: sitting Prep: site prepped and draped and DuraPrep Patient monitoring: heart rate, cardiac monitor, continuous pulse ox and blood pressure Approach: midline Location: L3-4 Injection technique: single-shot Needle Needle type: Pencan  Needle gauge: 24 G Needle length: 9 cm Needle insertion depth: 6 cm Assessment Sensory level: T4

## 2014-08-22 NOTE — MAU Note (Signed)
Sent from office for further evaluation. Patient states the baby is measuring large and she has a lot of fluid, so a C/S is planned.

## 2014-08-22 NOTE — Op Note (Signed)
Preoperative diagnosis: Intrauterine pregnancy at 40 weeks and 5 days, suspected macrosomia, polyhydramnios  Post operative diagnosis: Same  Anesthesia: Spinal  Anesthesiologist: Dr. Arby BarretteHatchett  Procedure: Primary low transverse cesarean section  Surgeon: Dr. Dois DavenportSandra Liliana Brentlinger  Assistant: Henreitta LeberElmira Powell PA-C  Estimated blood loss: 700 cc  Procedure:  After being informed of the planned procedure and possible complications including bleeding, infection, injury to other organs, informed consent is obtained. The patient is taken to OR #9 and given spinal anesthesia without complication. She is placed in the dorsal decubitus position with the pelvis tilted to the left. She is then prepped and draped in a sterile fashion. A Foley catheter is inserted in her bladder.  After assessing adequate level of anesthesia, we infiltrate the suprapubic area with 20 cc of Marcaine 0.25 and perform a Pfannenstiel incision which is brought down sharply to the fascia. The fascia is entered in a low transverse fashion. Linea alba is dissected. Peritoneum is entered in a midline fashion. An Alexis retractor is easily positioned.   The myometrium is then entered in a low transverse fashion, 2 cm above the vesico-uterine junction ; first with knife and then extended bluntly. Amniotic fluid is clear and abundant. We assist the birth of a female  infant in vertex presentation. Mouth and nose are suctioned. The baby is delivered. The cord is clamped and sectioned. The baby is given to the neonatologist present in the room.  10 cc of blood is drawn from the umbilical vein.The placenta is allowed to deliver spontaneously. It is complete and the cord has 3 vessels. Uterine revision is negative.  We proceed with closure of the myometrium in 2 layers: First with a running locked suture of 0 Vicryl, then with a Lembert suture of 0 Vicryl imbricating the first one. Hemostasis is completed with cauterization on peritoneal  edges.  Both paracolic gutters are cleaned. Both tubes and ovaries are assessed and normal. The pelvis is profusely irrigated with warm saline to confirm a satisfactory hemostasis.  Retractors and sponges are removed. Under fascia hemostasis is completed with cauterization. The fascia is then closed with 2 running sutures of 0 Vicryl meeting midline. The wound is irrigated with warm saline and hemostasis is completed with cauterization. The skin is closed with a subcuticular suture of 3-0 Monocryl and Steri-Strips.  Instrument and sponge count is complete x2. Estimated blood loss is 700 cc.  The procedure is well tolerated by the patient who is taken to recovery room in a well and stable condition.  female baby named Consuello MasseKadiir was born at 17:04 and received an Apgar of 8  at 1 minute and 9 at 5 minutes and weigh 9 lbs  Specimen: Placenta sent to [facility] & [facility]   Milina Pagett A MD 7/18/20165:39 PM

## 2014-08-22 NOTE — Anesthesia Postprocedure Evaluation (Signed)
Anesthesia Post Note  Patient: Melody Farley  Procedure(s) Performed: Procedure(s) (LRB): CESAREAN SECTION (N/A)  Anesthesia type: Spinal  Patient location: PACU  Post pain: Pain level controlled  Post assessment: Post-op Vital signs reviewed  Last Vitals:  Filed Vitals:   08/22/14 1845  BP: 121/80  Pulse: 73  Temp:   Resp: 16    Post vital signs: Reviewed  Level of consciousness: awake  Complications: No apparent anesthesia complications

## 2014-08-23 ENCOUNTER — Encounter (HOSPITAL_COMMUNITY): Payer: Self-pay | Admitting: Obstetrics and Gynecology

## 2014-08-23 LAB — CBC
HEMATOCRIT: 27.1 % — AB (ref 36.0–46.0)
Hemoglobin: 8.9 g/dL — ABNORMAL LOW (ref 12.0–15.0)
MCH: 27.5 pg (ref 26.0–34.0)
MCHC: 32.8 g/dL (ref 30.0–36.0)
MCV: 83.6 fL (ref 78.0–100.0)
Platelets: 123 10*3/uL — ABNORMAL LOW (ref 150–400)
RBC: 3.24 MIL/uL — AB (ref 3.87–5.11)
RDW: 13.6 % (ref 11.5–15.5)
WBC: 10.4 10*3/uL (ref 4.0–10.5)

## 2014-08-23 LAB — RPR: RPR Ser Ql: NONREACTIVE

## 2014-08-23 LAB — PROTEIN / CREATININE RATIO, URINE: CREATININE, URINE: 60 mg/dL

## 2014-08-23 NOTE — Progress Notes (Addendum)
Subjective: Postpartum Day 1: Cesarean Delivery - pt request due to suspected macrosomia Patient up ad lib, reports no syncope or dizziness. Feeding:  breast Contraceptive plan:  unsure  Objective: Vital signs in last 24 hours: Temp:  [97.6 F (36.4 C)-98.7 F (37.1 C)] 98 F (36.7 C) (07/19 0400) Pulse Rate:  [54-100] 61 (07/19 0400) Resp:  [14-20] 16 (07/19 0400) BP: (116-149)/(69-106) 138/76 mmHg (07/19 0400) SpO2:  [98 %-100 %] 99 % (07/19 0400)  Physical Exam:  General: alert and cooperative Lochia: appropriate Uterine Fundus: firm Abdomen:  + bowel sounds, non distended Incision: no significant drainage  Honeycomb dressing CDI DVT Evaluation: No evidence of DVT seen on physical exam. Homan's sign: Negative   Recent Labs  08/22/14 1240 08/23/14 0600  HGB 10.8* 8.9*  HCT 33.8* 27.1*  WBC 8.7 10.4    Assessment: Status post Cesarean section day 1. Doing well postoperatively.  Honeycomb dressing in place, no significant drainage Anemia - hemodynamicly stable.    Plan: Continue current care. Breastfeeding and Lactation consult    Venus Standard, CNM, MSN 08/23/2014. 6:33 AM  Seen and agreed BP have for the most part normalized and patient denies Sx of pre-eclampsia but platelets now at 123 Will order PCR: patient informed [facility] >0.3[facility]

## 2014-08-23 NOTE — Addendum Note (Signed)
Addendum  created 08/23/14 0757 by Shanon PayorSuzanne M Allysson Rinehimer, CRNA   Modules edited: Notes Section   Notes Section:  File: [id number]

## 2014-08-23 NOTE — Anesthesia Postprocedure Evaluation (Signed)
  Anesthesia Post-op Note  Patient: Melody Farley  Procedure(s) Performed: Procedure(s): CESAREAN SECTION (N/A)  Patient Location: Mother/Baby  Anesthesia Type:Spinal  Level of Consciousness: awake, alert  and oriented  Airway and Oxygen Therapy: Patient Spontanous Breathing  Post-op Pain: none  Post-op Assessment: Post-op Vital signs reviewed, Patient's Cardiovascular Status Stable, Respiratory Function Stable, No signs of Nausea or vomiting, Adequate PO intake, Pain level controlled, No headache and No backache  Post-op Vital Signs: Reviewed and stable  Last Vitals:  Filed Vitals:   08/23/14 0400  BP: 138/76  Pulse: 61  Temp: 36.7 C  Resp: 16    Complications: No apparent anesthesia complications

## 2014-08-24 MED ORDER — FERROUS SULFATE 325 (65 FE) MG PO TABS: 325.0000 mg | ORAL_TABLET | Freq: Every day | ORAL | Status: DC

## 2014-08-24 MED ORDER — IBUPROFEN 600 MG PO TABS: 600.0000 mg | ORAL_TABLET | Freq: Four times a day (QID) | ORAL | Status: AC

## 2014-08-24 MED ORDER — OXYCODONE-ACETAMINOPHEN 5-325 MG PO TABS: 1.0000 | ORAL_TABLET | ORAL | Status: DC | PRN

## 2014-08-24 NOTE — Discharge Summary (Signed)
Cesarean Section Delivery Discharge Summary  Melody Farley  DOB:    04/01/1990 MRN:    [id number] CSN:    [id number]  Date of admission:                  08/22/14  Date of discharge:                   08/24/14  Procedures this admission: Cesarean delivery  Date of Delivery: 08/22/14  Newborn Data:  Live born female  Birth Weight: 9 lb 7.7 oz (4301 g) APGAR: 8, 9  Home with mother. Circumcision Plan: Done inpatient  History of Present Illness:  Ms. Melody Farley is a 24 y.o. female, G1P1001, who presents at 3865w5d weeks gestation. [facility]   Her pregnancy has been complicated by:  Patient Active Problem List   Diagnosis Date Noted  . Cesarean delivery delivered 08/22/2014  . [redacted] weeks gestation of pregnancy   . Encounter for fetal anatomic survey   . Post term pregnancy over 40 weeks   . Non-reactive NST (non-stress test)     Hospital Course  Due to suspected fetal macrosomia, she was consented for cesarean, with Dr. Estanislado Pandyivard performing a LTCS under spinal anesthesia, with delivery of a viable female, with weight 9 lbs 7.7 oz,  and Apgars 8,9.   Infant was in good condition and remained at the patient's bedside.  The patient was taken to recovery in good condition.  Patient planned to breast feed.  On post-op day 1, patient was doing well, tolerating a regular diet, with Hgb of 8.9.  Throughout her stay, her physical exam was WNL, her incision was CDI, and her vital signs remained stable.  By post-op day 2 she was up ad lib, tolerating a regular diet, with good pain control with po med.  She was deemed to have received the full benefit of her hospital stay, and was discharged home in stable condition.  Contraceptive choice was abstinence.  She also had gestational hypertension with well-controlled blood pressures postpartum. A visiting nurse service was scheduled for  weekly blood pressure check for the next 2 weeks. I reviewed with patient's signs and symptoms of preeclampsia  Feeding:  breast  Contraception:  abstinence  Hemoglobin Results:  CBC Latest Ref Rng 08/23/2014 08/22/2014 03/21/2014  WBC 4.0 - 10.5 K/uL 10.4 8.7 8.3  Hemoglobin 12.0 - 15.0 g/dL 7.8(G8.9(L) 10.8(L) 11.3(L)  Hematocrit 36.0 - 46.0 % 27.1(L) 33.8(L) 34.0(L)  Platelets 150 - 400 K/uL 123(L) 141(L) 218     Discharge Physical Exam:   General: alert and cooperative Lochia: appropriate Uterine Fundus: firm Abdomen:  + bowel sounds,  Incision: healing well, dried drainage on honeycomb noted DVT Evaluation: No evidence of DVT seen on physical exam. Negative Homan's sign. No significant calf/ankle edema.  Prenatal labs: ABO, Rh: B POS (07/18 1240) Antibody: NEG (07/18 1240) Rubella:  Immune RPR:   negative HBsAg:   negative HIV:   negative GBS:   negative  Intrapartum Procedures: cesarean: low cervical, transverse Postpartum Procedures: Same as above Complications-Operative and Postpartum: none  Discharge Diagnoses: Term Pregnancy-delivered and Fetal macrosomia gestational hypertension  Discharge Information:  Activity:           unrestricted Diet:                Regular Medications: PNV, Ibuprofen, Iron and Percocet Condition:      stable Instructions:  Discharge to: home    Follow-up Information    Follow up with [facility], MD In 6 weeks.   Specialty:  Obstetrics and Gynecology   Why:  Postpartum visit   Contact information:   [address] [place] [address] [phone]        Konrad Felix MD 08/24/2014 12:27 PM

## 2014-08-24 NOTE — Progress Notes (Signed)
Spoke with Alma Downsheresa Tollison regarding one week blood pressure check.

## 2014-09-04 ENCOUNTER — Inpatient Hospital Stay (HOSPITAL_COMMUNITY)
Admission: AD | Admit: 2014-09-04 | Discharge: 2014-09-04 | Disposition: A | Discharge status: Home / Self Care | Payer: BLUE CROSS/BLUE SHIELD | Source: Ambulatory Visit | Attending: Obstetrics and Gynecology | Admitting: Obstetrics and Gynecology

## 2014-09-04 ENCOUNTER — Encounter (HOSPITAL_COMMUNITY): Payer: Self-pay

## 2014-09-04 DIAGNOSIS — R030 Elevated blood-pressure reading, without diagnosis of hypertension: Secondary | ICD-10-CM | POA: Insufficient documentation

## 2014-09-04 DIAGNOSIS — R42 Dizziness and giddiness: Secondary | ICD-10-CM | POA: Insufficient documentation

## 2014-09-04 DIAGNOSIS — IMO0001 Reserved for inherently not codable concepts without codable children: Secondary | ICD-10-CM

## 2014-09-04 DIAGNOSIS — R51 Headache: Secondary | ICD-10-CM | POA: Insufficient documentation

## 2014-09-04 DIAGNOSIS — O9989 Other specified diseases and conditions complicating pregnancy, childbirth and the puerperium: Secondary | ICD-10-CM | POA: Insufficient documentation

## 2014-09-04 HISTORY — DX: Gestational (pregnancy-induced) hypertension without significant proteinuria, unspecified trimester: O13.9

## 2014-09-04 LAB — COMPREHENSIVE METABOLIC PANEL
ALT: 20 U/L (ref 14–54)
AST: 27 U/L (ref 15–41)
Albumin: 3.9 g/dL (ref 3.5–5.0)
Alkaline Phosphatase: 140 U/L — ABNORMAL HIGH (ref 38–126)
Anion gap: 6 (ref 5–15)
BUN: 14 mg/dL (ref 6–20)
CO2: 24 mmol/L (ref 22–32)
Calcium: 9.1 mg/dL (ref 8.9–10.3)
Chloride: 106 mmol/L (ref 101–111)
Creatinine, Ser: 0.7 mg/dL (ref 0.44–1.00)
GFR calc Af Amer: >60 mL/min (ref >60)
Glucose, Bld: 83 mg/dL (ref 65–99)
Potassium: 3.9 mmol/L (ref 3.5–5.1)
Sodium: 136 mmol/L (ref 135–145)
Total Bilirubin: 0.5 mg/dL (ref 0.3–1.2)
Total Protein: 7.5 g/dL (ref 6.5–8.1)

## 2014-09-04 LAB — PROTEIN / CREATININE RATIO, URINE
Creatinine, Urine: 314 mg/dL
PROTEIN CREATININE RATIO: 0.07 mg/mg{creat} (ref 0.00–0.15)
Total Protein, Urine: 23 mg/dL

## 2014-09-04 LAB — CBC
HCT: 34.2 % — ABNORMAL LOW (ref 36.0–46.0)
HEMOGLOBIN: 11 g/dL — AB (ref 12.0–15.0)
MCH: 27.1 pg (ref 26.0–34.0)
MCHC: 32.2 g/dL (ref 30.0–36.0)
MCV: 84.2 fL (ref 78.0–100.0)
Platelets: 393 10*3/uL (ref 150–400)
RBC: 4.06 MIL/uL (ref 3.87–5.11)
RDW: 13.2 % (ref 11.5–15.5)
WBC: 8.8 10*3/uL (ref 4.0–10.5)

## 2014-09-04 LAB — LACTATE DEHYDROGENASE: LDH: 195 U/L — AB (ref 98–192)

## 2014-09-04 LAB — URIC ACID: Uric Acid, Serum: 7.9 mg/dL — ABNORMAL HIGH (ref 2.3–6.6)

## 2014-09-04 MED ORDER — ACETAMINOPHEN 325 MG PO TABS
650.0000 mg | ORAL_TABLET | Freq: Once | ORAL | Status: AC
  Administered 2014-09-04: 650 mg via ORAL
  Filled 2014-09-04: qty 2

## 2014-09-04 NOTE — MAU Provider Note (Signed)
History  24 yo G1 now P1 s/p c-section on 08/22/14 called stating she was supposed to arrive to MAU yesterday (but did not come) for BP check as [facility] Nurse was not able to come visit her this past week.  Pt gives h/o elevated BPs post delivery - states pressures are "just being monitored."   Reports h/a 3/10 and dizziness that just started today. Denies epigastric pain or difficulty breathing. Poor appetite. Last BM this visit.   Patient Active Problem List   Diagnosis Date Noted  . Cesarean delivery delivered 08/22/2014  . [redacted] weeks gestation of pregnancy   . Encounter for fetal anatomic survey   . Post term pregnancy over 40 weeks   . Non-reactive NST (non-stress test)     No chief complaint on file.  HPI As above OB History    Gravida Para Term Preterm AB TAB SAB Ectopic Multiple Living   1 1 1       0 1      Past Medical History  Diagnosis Date  . Asthma   . Ovarian cyst   . Pregnancy induced hypertension     Past Surgical History  Procedure Laterality Date  . No past surgeries    . Cesarean section N/A 08/22/2014    Procedure: CESAREAN SECTION;  Surgeon: Silverio Lay, MD;  Location: [facility];  Service: Obstetrics;  Laterality: N/A;    Family History  Problem Relation Age of Onset  . Hypertension Mother   . Asthma Mother   . Anemia Mother   . Hypertension Maternal Grandmother   . Arthritis Maternal Grandmother   . Asthma Maternal Grandmother   . Hypothyroidism Maternal Grandmother     History  Substance Use Topics  . Smoking status: Never Smoker   . Smokeless tobacco: Never Used  . Alcohol Use: No     Comment: occ    Allergies:  Allergies  Allergen Reactions  . Latex Rash    Prescriptions prior to admission  Medication Sig Dispense Refill Last Dose  . albuterol (PROVENTIL HFA;VENTOLIN HFA) 108 (90 BASE) MCG/ACT inhaler Inhale 1-2 puffs into the lungs every 6 (six) hours as needed for wheezing or shortness of breath.   PRN at PRN  . ferrous  sulfate 325 (65 FE) MG tablet Take 1 tablet (325 mg total) by mouth daily. 30 tablet 3 09/04/2014 at Unknown time  . ibuprofen (ADVIL,MOTRIN) 600 MG tablet Take 1 tablet (600 mg total) by mouth every 6 (six) hours. (Patient taking differently: Take 600 mg by mouth every 6 (six) hours as needed for mild pain. ) 30 tablet 0 Past Week at Unknown time  . oxyCODONE-acetaminophen (PERCOCET/ROXICET) 5-325 MG per tablet Take 1 tablet by mouth every 4 (four) hours as needed (for pain scale 4-7). 30 tablet 0 Past Week at Unknown time  . Prenatal Vit-Fe Fumarate-FA (PRENATAL MULTIVITAMIN) TABS tablet Take 1 tablet by mouth daily.    09/04/2014 at Unknown time    ROS  As detailed in history Physical Exam   Blood pressure 123/95, pulse 91, temperature 98.5 F (36.9 C), resp. rate 18, height 5' 3.5" (1.613 m), weight 70.58 kg (155 lb 9.6 oz), last menstrual period 10/20/2013, SpO2 98 %, unknown if currently breastfeeding.  Today's Vitals   09/04/14 2141 09/04/14 2201 09/04/14 2221 09/04/14 2308  BP: 123/95 127/83 117/85   Pulse: 91 89 87   Temp:      Resp:      Height:      Weight:  SpO2:      PainSc:    0-No pain     Physical Exam Gen: NAD Lungs: CTAB CV: RRR w/o M/R/G Abdomen: soft, NT, well healed low transverse abdominal scar, +BS Ext: 1+ DTRs bilaterally, no clonus, no edema ED Course  Assessment: 24 yo G1 now P1, s/p primary c-section on 08/22/14 due to suspected macrosomia and polyhydramnios. Elevated BPs (no meds) - here for BP check.  Plan: Serial BPs PreE labs Tylenol   Sherre Scarlet CNM, MS 09/04/2014 9:50 PM     Results for orders placed or performed during the hospital encounter of 09/04/14 (from the past 24 hour(s))  Protein / creatinine ratio, urine     Status: None   Collection Time: 09/04/14  9:13 PM  Result Value Ref Range   Creatinine, Urine 314.00 mg/dL   Total Protein, Urine 23 mg/dL   Protein Creatinine Ratio 0.07 0.00 - 0.15 mg/mg[Cre]  CBC      Status: Abnormal   Collection Time: 09/04/14  9:35 PM  Result Value Ref Range   WBC 8.8 4.0 - 10.5 K/uL   RBC 4.06 3.87 - 5.11 MIL/uL   Hemoglobin 11.0 (L) 12.0 - 15.0 g/dL   HCT 16.1 (L) 09.6 - 04.5 %   MCV 84.2 78.0 - 100.0 fL   MCH 27.1 26.0 - 34.0 pg   MCHC 32.2 30.0 - 36.0 g/dL   RDW 40.9 81.1 - 91.4 %   Platelets 393 150 - 400 K/uL  Comprehensive metabolic panel     Status: Abnormal   Collection Time: 09/04/14  9:35 PM  Result Value Ref Range   Sodium 136 135 - 145 mmol/L   Potassium 3.9 3.5 - 5.1 mmol/L   Chloride 106 101 - 111 mmol/L   CO2 24 22 - 32 mmol/L   Glucose, Bld 83 65 - 99 mg/dL   BUN 14 6 - 20 mg/dL   Creatinine, Ser 7.82 0.44 - 1.00 mg/dL   Calcium 9.1 8.9 - 95.6 mg/dL   Total Protein 7.5 6.5 - 8.1 g/dL   Albumin 3.9 3.5 - 5.0 g/dL   AST 27 15 - 41 U/L   ALT 20 14 - 54 U/L   Alkaline Phosphatase 140 (H) 38 - 126 U/L   Total Bilirubin 0.5 0.3 - 1.2 mg/dL   GFR calc non Af Amer >60 >60 mL/min   GFR calc Af Amer >60 >60 mL/min   Anion gap 6 5 - 15  Lactate dehydrogenase     Status: Abnormal   Collection Time: 09/04/14  9:35 PM  Result Value Ref Range   LDH 195 (H) 98 - 192 U/L  Uric acid     Status: Abnormal   Collection Time: 09/04/14  9:35 PM  Result Value Ref Range   Uric Acid, Serum 7.9 (H) 2.3 - 6.6 mg/dL   ADDENDUM:  Consulted Dr. Normand Sloop. Plan is to have [facility] Nurse visit pt on Wednesday of this week. No antihypertensive medication needed at this time per Dr. Normand Sloop. Headache resolved w/ Tylenol. Reviewed dizziness in detail, along with its relief measures --- resolved while here in MAU. Encouraged pt to eat small frequent meals throughout day. PreE labs reviewed with Dr. Normand Sloop.   Sherre Scarlet, CNM 09/04/14, 10:53 PM

## 2014-09-04 NOTE — MAU Note (Signed)
Pt here for BP check, was told to come by provider yesterday to have it checked. Pt also c/o "dizzy spells" that started a couple of hours ago. Has a headache that she rates 3/10. Denies vision changes. Denies chest pain, SOB or N/V.

## 2014-09-04 NOTE — Discharge Instructions (Signed)

## 2015-04-27 ENCOUNTER — Emergency Department (HOSPITAL_COMMUNITY)
Admission: EM | Admit: 2015-04-27 | Discharge: 2015-04-27 | Disposition: A | Discharge status: Home / Self Care | Payer: BLUE CROSS/BLUE SHIELD | Attending: Emergency Medicine | Admitting: Emergency Medicine

## 2015-04-27 ENCOUNTER — Emergency Department (HOSPITAL_COMMUNITY): Payer: BLUE CROSS/BLUE SHIELD

## 2015-04-27 ENCOUNTER — Encounter (HOSPITAL_COMMUNITY): Payer: Self-pay | Admitting: *Deleted

## 2015-04-27 DIAGNOSIS — S199XXA Unspecified injury of neck, initial encounter: Secondary | ICD-10-CM | POA: Diagnosis not present

## 2015-04-27 DIAGNOSIS — Y9389 Activity, other specified: Secondary | ICD-10-CM | POA: Diagnosis not present

## 2015-04-27 DIAGNOSIS — Z8742 Personal history of other diseases of the female genital tract: Secondary | ICD-10-CM | POA: Insufficient documentation

## 2015-04-27 DIAGNOSIS — Y92481 Parking lot as the place of occurrence of the external cause: Secondary | ICD-10-CM | POA: Diagnosis not present

## 2015-04-27 DIAGNOSIS — J45909 Unspecified asthma, uncomplicated: Secondary | ICD-10-CM | POA: Insufficient documentation

## 2015-04-27 DIAGNOSIS — Z91040 Latex allergy status: Secondary | ICD-10-CM | POA: Insufficient documentation

## 2015-04-27 DIAGNOSIS — Z79899 Other long term (current) drug therapy: Secondary | ICD-10-CM | POA: Diagnosis not present

## 2015-04-27 DIAGNOSIS — S0990XA Unspecified injury of head, initial encounter: Secondary | ICD-10-CM | POA: Diagnosis present

## 2015-04-27 DIAGNOSIS — S0083XA Contusion of other part of head, initial encounter: Secondary | ICD-10-CM | POA: Insufficient documentation

## 2015-04-27 DIAGNOSIS — Y998 Other external cause status: Secondary | ICD-10-CM | POA: Insufficient documentation

## 2015-04-27 DIAGNOSIS — S0093XA Contusion of unspecified part of head, initial encounter: Secondary | ICD-10-CM

## 2015-04-27 MED ORDER — IBUPROFEN 400 MG PO TABS: 400.0000 mg | ORAL_TABLET | Freq: Once | ORAL | Status: DC

## 2015-04-27 MED ORDER — OXYCODONE-ACETAMINOPHEN 5-325 MG PO TABS: 1.0000 | ORAL_TABLET | Freq: Once | ORAL | Status: DC

## 2015-04-27 MED ORDER — OXYCODONE-ACETAMINOPHEN 5-325 MG PO TABS
1.0000 | ORAL_TABLET | ORAL | Status: DC | PRN
  Administered 2015-04-27: 1 via ORAL

## 2015-04-27 MED ORDER — OXYCODONE-ACETAMINOPHEN 5-325 MG PO TABS
ORAL_TABLET | ORAL | Status: AC
  Filled 2015-04-27: qty 1

## 2015-04-27 NOTE — ED Notes (Signed)
Pt back from CT scan via stretcher, accompanied by Transporter. NAD noted at this time.
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
## 2018-08-19 ENCOUNTER — Other Ambulatory Visit: Payer: Self-pay

## 2018-08-19 ENCOUNTER — Encounter (HOSPITAL_COMMUNITY): Payer: Self-pay | Admitting: Emergency Medicine

## 2018-08-19 ENCOUNTER — Emergency Department (HOSPITAL_COMMUNITY): Payer: BC Managed Care – PPO

## 2018-08-19 ENCOUNTER — Emergency Department (HOSPITAL_COMMUNITY)
Admission: EM | Admit: 2018-08-19 | Discharge: 2018-08-19 | Disposition: A | Discharge status: Home / Self Care | Payer: BC Managed Care – PPO | Attending: Emergency Medicine | Admitting: Emergency Medicine

## 2018-08-19 DIAGNOSIS — J45909 Unspecified asthma, uncomplicated: Secondary | ICD-10-CM | POA: Insufficient documentation

## 2018-08-19 DIAGNOSIS — Z91040 Latex allergy status: Secondary | ICD-10-CM | POA: Insufficient documentation

## 2018-08-19 DIAGNOSIS — R0781 Pleurodynia: Secondary | ICD-10-CM | POA: Diagnosis not present

## 2018-08-19 DIAGNOSIS — R079 Chest pain, unspecified: Secondary | ICD-10-CM | POA: Diagnosis present

## 2018-08-19 MED ORDER — ALBUTEROL SULFATE HFA 108 (90 BASE) MCG/ACT IN AERS: 1.0000 | INHALATION_SPRAY | Freq: Four times a day (QID) | RESPIRATORY_TRACT | 0 refills | Status: DC | PRN

## 2018-08-19 MED ORDER — ALBUTEROL SULFATE HFA 108 (90 BASE) MCG/ACT IN AERS
2.0000 | INHALATION_SPRAY | Freq: Once | RESPIRATORY_TRACT | Status: AC
  Administered 2018-08-19: 2 via RESPIRATORY_TRACT
  Filled 2018-08-19: qty 6.7

## 2018-08-19 NOTE — Discharge Instructions (Signed)
Your work-up in the emergency department was reassuring and did not reveal a concerning cause of your symptoms.  We recommend use of Tylenol or ibuprofen for management of any ongoing discomfort.  Follow-up with a primary care doctor.  You may return for new or concerning symptoms.
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
## 2018-09-07 ENCOUNTER — Emergency Department (HOSPITAL_COMMUNITY)
Admission: EM | Admit: 2018-09-07 | Discharge: 2018-09-08 | Disposition: A | Discharge status: Home / Self Care | Payer: BC Managed Care – PPO | Attending: Emergency Medicine | Admitting: Emergency Medicine

## 2018-09-07 ENCOUNTER — Encounter (HOSPITAL_COMMUNITY): Payer: Self-pay | Admitting: *Deleted

## 2018-09-07 ENCOUNTER — Other Ambulatory Visit: Payer: Self-pay

## 2018-09-07 DIAGNOSIS — Z20828 Contact with and (suspected) exposure to other viral communicable diseases: Secondary | ICD-10-CM | POA: Diagnosis not present

## 2018-09-07 DIAGNOSIS — F419 Anxiety disorder, unspecified: Secondary | ICD-10-CM | POA: Diagnosis not present

## 2018-09-07 DIAGNOSIS — J45909 Unspecified asthma, uncomplicated: Secondary | ICD-10-CM | POA: Insufficient documentation

## 2018-09-07 DIAGNOSIS — M549 Dorsalgia, unspecified: Secondary | ICD-10-CM | POA: Insufficient documentation

## 2018-09-07 DIAGNOSIS — R110 Nausea: Secondary | ICD-10-CM | POA: Diagnosis present

## 2018-09-07 DIAGNOSIS — R51 Headache: Secondary | ICD-10-CM | POA: Insufficient documentation

## 2018-09-07 DIAGNOSIS — F410 Panic disorder [episodic paroxysmal anxiety] without agoraphobia: Secondary | ICD-10-CM | POA: Diagnosis not present

## 2018-09-07 DIAGNOSIS — R079 Chest pain, unspecified: Secondary | ICD-10-CM | POA: Insufficient documentation

## 2018-09-07 DIAGNOSIS — Z91040 Latex allergy status: Secondary | ICD-10-CM | POA: Insufficient documentation

## 2018-09-07 DIAGNOSIS — E860 Dehydration: Secondary | ICD-10-CM | POA: Insufficient documentation

## 2018-09-07 DIAGNOSIS — R6883 Chills (without fever): Secondary | ICD-10-CM | POA: Insufficient documentation

## 2018-09-07 DIAGNOSIS — R5381 Other malaise: Secondary | ICD-10-CM | POA: Diagnosis not present

## 2018-09-07 LAB — CBC
HCT: 39.9 % (ref 36.0–46.0)
Hemoglobin: 12.8 g/dL (ref 12.0–15.0)
MCH: 27.8 pg (ref 26.0–34.0)
MCHC: 32.1 g/dL (ref 30.0–36.0)
MCV: 86.7 fL (ref 80.0–100.0)
Platelets: 229 10*3/uL (ref 150–400)
RBC: 4.6 MIL/uL (ref 3.87–5.11)
RDW: 12.9 % (ref 11.5–15.5)
WBC: 7.6 10*3/uL (ref 4.0–10.5)
nRBC: 0 % (ref 0.0–0.2)

## 2018-09-07 LAB — COMPREHENSIVE METABOLIC PANEL
ALT: 12 U/L (ref 0–44)
AST: 19 U/L (ref 15–41)
Albumin: 4.2 g/dL (ref 3.5–5.0)
Alkaline Phosphatase: 48 U/L (ref 38–126)
Anion gap: 11 (ref 5–15)
BUN: <5 mg/dL — ABNORMAL LOW (ref 6–20)
CO2: 20 mmol/L — ABNORMAL LOW (ref 22–32)
Calcium: 9.4 mg/dL (ref 8.9–10.3)
Chloride: 106 mmol/L (ref 98–111)
Creatinine, Ser: 0.72 mg/dL (ref 0.44–1.00)
GFR calc Af Amer: >60 mL/min (ref >60)
GFR calc non Af Amer: >60 mL/min (ref >60)
Glucose, Bld: 106 mg/dL — ABNORMAL HIGH (ref 70–99)
Potassium: 4.4 mmol/L (ref 3.5–5.1)
Sodium: 137 mmol/L (ref 135–145)
Total Bilirubin: 0.8 mg/dL (ref 0.3–1.2)
Total Protein: 7.5 g/dL (ref 6.5–8.1)

## 2018-09-07 LAB — I-STAT BETA HCG BLOOD, ED (MC, WL, AP ONLY): I-stat hCG, quantitative: <5 m[IU]/mL (ref <5)

## 2018-09-07 LAB — URINALYSIS, ROUTINE W REFLEX MICROSCOPIC
Bilirubin Urine: NEGATIVE
Glucose, UA: NEGATIVE mg/dL
Ketones, ur: 80 mg/dL — AB
Leukocytes,Ua: NEGATIVE
Nitrite: NEGATIVE
Protein, ur: 30 mg/dL — AB
Specific Gravity, Urine: 1.026 (ref 1.005–1.030)
pH: 5 (ref 5.0–8.0)

## 2018-09-07 LAB — LIPASE, BLOOD: Lipase: 45 U/L (ref 11–51)

## 2018-09-07 MED ORDER — SODIUM CHLORIDE 0.9% FLUSH: 3.0000 mL | Freq: Once | INTRAVENOUS | Status: DC

## 2018-09-07 NOTE — ED Triage Notes (Signed)
Pt has multiple complaints. Reports headache for several days with nausea, upper back pain and abd pains. No acute distress is noted at triage.

## 2018-09-08 ENCOUNTER — Emergency Department (HOSPITAL_COMMUNITY)
Admission: EM | Admit: 2018-09-08 | Discharge: 2018-09-09 | Disposition: A | Discharge status: Home / Self Care | Payer: BC Managed Care – PPO | Source: Home / Self Care | Attending: Emergency Medicine | Admitting: Emergency Medicine

## 2018-09-08 DIAGNOSIS — R51 Headache: Secondary | ICD-10-CM | POA: Insufficient documentation

## 2018-09-08 DIAGNOSIS — E860 Dehydration: Secondary | ICD-10-CM

## 2018-09-08 DIAGNOSIS — F419 Anxiety disorder, unspecified: Secondary | ICD-10-CM | POA: Insufficient documentation

## 2018-09-08 DIAGNOSIS — R110 Nausea: Secondary | ICD-10-CM

## 2018-09-08 DIAGNOSIS — J45909 Unspecified asthma, uncomplicated: Secondary | ICD-10-CM | POA: Insufficient documentation

## 2018-09-08 DIAGNOSIS — F410 Panic disorder [episodic paroxysmal anxiety] without agoraphobia: Secondary | ICD-10-CM

## 2018-09-08 DIAGNOSIS — Z20828 Contact with and (suspected) exposure to other viral communicable diseases: Secondary | ICD-10-CM | POA: Insufficient documentation

## 2018-09-08 DIAGNOSIS — Z20822 Contact with and (suspected) exposure to covid-19: Secondary | ICD-10-CM

## 2018-09-08 DIAGNOSIS — Z91040 Latex allergy status: Secondary | ICD-10-CM | POA: Insufficient documentation

## 2018-09-08 MED ORDER — PROMETHAZINE HCL 25 MG/ML IJ SOLN
25.0000 mg | Freq: Once | INTRAMUSCULAR | Status: AC
  Administered 2018-09-08: 25 mg via INTRAMUSCULAR
  Filled 2018-09-08: qty 1

## 2018-09-08 MED ORDER — PROMETHAZINE HCL 25 MG PO TABS
25.0000 mg | ORAL_TABLET | Freq: Once | ORAL | Status: AC
  Administered 2018-09-08: 25 mg via ORAL
  Filled 2018-09-08: qty 1

## 2018-09-08 MED ORDER — PROMETHAZINE HCL 25 MG PO TABS: 25.0000 mg | ORAL_TABLET | Freq: Four times a day (QID) | ORAL | 0 refills | Status: AC | PRN

## 2018-09-08 MED ORDER — ONDANSETRON 4 MG PO TBDP
4.0000 mg | ORAL_TABLET | Freq: Once | ORAL | Status: AC
  Administered 2018-09-08: 02:00:00 4 mg via ORAL
  Filled 2018-09-08: qty 1

## 2018-09-08 NOTE — ED Notes (Signed)
Patient verbalizes understanding of discharge instructions. Opportunity for questioning and answers were provided. Armband removed by staff, pt discharged from ED ambulatory with significant other   

## 2018-09-08 NOTE — ED Provider Notes (Addendum)
[facility] Provider Note   CSN: [id number] Arrival date & time: 09/07/18  1857     History   Chief Complaint Chief Complaint  Patient presents with  . Nausea  . Headache  . Abdominal Pain    HPI Melody Farley is a 28 y.o. female.     Patient to ED with report of 3 days of pressure-like, bilateral, temporal headache, nausea (one episode vomiting on day 1, none since), upper back discomfort without pattern. No fever, cough, SOB, diarrhea, congestion, sore throat. She denies chance of pregnancy and is due for her menses. No vaginal discharge, urinary symptoms. She has been able to drink water today but has not had anything to eat secondary to nausea. She reports chest discomfort that is unchanged from previous ED evaluation in July, 2020, and is following up with PCP for this.   The history is provided by the patient. No language interpreter was used.  Headache Associated symptoms: back pain (C/O upper back pain.) and nausea   Associated symptoms: no congestion, no cough, no diarrhea, no fever and no sore throat   Abdominal Pain Associated symptoms: chest pain (See HPI.), chills and nausea   Associated symptoms: no cough, no diarrhea, no dysuria, no fever, no shortness of breath, no sore throat and no vaginal discharge     Past Medical History:  Diagnosis Date  . Asthma   . Ovarian cyst   . Pregnancy induced hypertension     Patient Active Problem List   Diagnosis Date Noted  . Elevated blood pressure 09/04/2014  . Cesarean delivery delivered 08/22/2014    Past Surgical History:  Procedure Laterality Date  . CESAREAN SECTION N/A 08/22/2014   Procedure: CESAREAN SECTION;  Surgeon: Silverio LaySandra Rivard, MD;  Location: [facility];  Service: Obstetrics;  Laterality: N/A;  . NO PAST SURGERIES       OB History    Gravida  1   Para  1   Term  1   Preterm      AB      Living  1     SAB      TAB      Ectopic      Multiple  0    Live Births  1            Home Medications    Prior to Admission medications   Medication Sig Start Date End Date Taking? Authorizing Provider  albuterol (VENTOLIN HFA) 108 (90 Base) MCG/ACT inhaler Inhale 1-2 puffs into the lungs every 6 (six) hours as needed for wheezing or shortness of breath. 08/19/18   Antony MaduraHumes, Kelly, PA-C  ferrous sulfate 325 (65 FE) MG tablet Take 1 tablet (325 mg total) by mouth daily. Patient not taking: Reported on 04/27/2015 08/24/14   Hoover BrownsKulwa, Ema, MD  ibuprofen (ADVIL,MOTRIN) 600 MG tablet Take 1 tablet (600 mg total) by mouth every 6 (six) hours. Patient not taking: Reported on 04/27/2015 08/24/14   Hoover BrownsKulwa, Ema, MD  oxyCODONE-acetaminophen (PERCOCET/ROXICET) 5-325 MG per tablet Take 1 tablet by mouth every 4 (four) hours as needed (for pain scale 4-7). Patient not taking: Reported on 04/27/2015 08/24/14   Hoover BrownsKulwa, Ema, MD    Family History Family History  Problem Relation Age of Onset  . Hypertension Mother   . Asthma Mother   . Anemia Mother   . Hypertension Maternal Grandmother   . Arthritis Maternal Grandmother   . Asthma Maternal Grandmother   . Hypothyroidism Maternal Grandmother  Social History Social History   Tobacco Use  . Smoking status: Never Smoker  . Smokeless tobacco: Never Used  Substance Use Topics  . Alcohol use: No    Comment: occ  . Drug use: No     Allergies   Latex   Review of Systems Review of Systems  Constitutional: Positive for appetite change and chills. Negative for fever.  HENT: Negative.  Negative for congestion and sore throat.   Respiratory: Negative.  Negative for cough and shortness of breath.   Cardiovascular: Positive for chest pain (See HPI.).  Gastrointestinal: Positive for nausea. Negative for diarrhea.  Genitourinary: Negative for dysuria and vaginal discharge.  Musculoskeletal: Positive for back pain (C/O upper back pain.).  Skin: Negative.   Neurological: Positive for headaches.      Physical Exam Updated Vital Signs BP 129/87 (BP Location: Left Arm)   Pulse (!) 56   Temp 98.3 F (36.8 C) (Oral)   Resp 18   LMP 08/11/2018 (Exact Date)   SpO2 99%   Physical Exam Vitals signs and nursing note reviewed.  Constitutional:      Appearance: She is well-developed.  HENT:     Head: Normocephalic and atraumatic.  Neck:     Musculoskeletal: Normal range of motion and neck supple.  Cardiovascular:     Rate and Rhythm: Normal rate and regular rhythm.  Pulmonary:     Effort: Pulmonary effort is normal.     Breath sounds: Normal breath sounds.  Abdominal:     General: Bowel sounds are normal.     Palpations: Abdomen is soft.     Tenderness: There is no abdominal tenderness. There is no rebound.  Musculoskeletal: Normal range of motion.  Skin:    General: Skin is warm and dry.     Findings: No rash.  Neurological:     Mental Status: She is alert.     Comments: CN's 3-12 grossly intact. Speech is clear and focused. No facial asymmetry. No lateralizing weakness. Reflexes are equal. No deficits of coordination. Ambulatory without imbalance.        ED Treatments / Results  Labs (all labs ordered are listed, but only abnormal results are displayed) Labs Reviewed  COMPREHENSIVE METABOLIC PANEL - Abnormal; Notable for the following components:      Result Value   CO2 20 (*)    Glucose, Bld 106 (*)    BUN <5 (*)    All other components within normal limits  URINALYSIS, ROUTINE W REFLEX MICROSCOPIC - Abnormal; Notable for the following components:   APPearance HAZY (*)    Hgb urine dipstick SMALL (*)    Ketones, ur 80 (*)    Protein, ur 30 (*)    Bacteria, UA FEW (*)    All other components within normal limits  LIPASE, BLOOD  CBC  I-STAT BETA HCG BLOOD, ED ([facility], [facility], [facility] ONLY)   Results for orders placed or performed during the hospital encounter of 09/07/18  Lipase, blood  Result Value Ref Range   Lipase 45 11 - 51 U/L  Comprehensive metabolic panel   Result Value Ref Range   Sodium 137 135 - 145 mmol/L   Potassium 4.4 3.5 - 5.1 mmol/L   Chloride 106 98 - 111 mmol/L   CO2 20 (L) 22 - 32 mmol/L   Glucose, Bld 106 (H) 70 - 99 mg/dL   BUN <5 (L) 6 - 20 mg/dL   Creatinine, Ser 0.72 0.44 - 1.00 mg/dL   Calcium 9.4 8.9 -  10.3 mg/dL   Total Protein 7.5 6.5 - 8.1 g/dL   Albumin 4.2 3.5 - 5.0 g/dL   AST 19 15 - 41 U/L   ALT 12 0 - 44 U/L   Alkaline Phosphatase 48 38 - 126 U/L   Total Bilirubin 0.8 0.3 - 1.2 mg/dL   GFR calc non Af Amer >60 >60 mL/min   GFR calc Af Amer >60 >60 mL/min   Anion gap 11 5 - 15  CBC  Result Value Ref Range   WBC 7.6 4.0 - 10.5 K/uL   RBC 4.60 3.87 - 5.11 MIL/uL   Hemoglobin 12.8 12.0 - 15.0 g/dL   HCT 16.139.9 09.636.0 - 04.546.0 %   MCV 86.7 80.0 - 100.0 fL   MCH 27.8 26.0 - 34.0 pg   MCHC 32.1 30.0 - 36.0 g/dL   RDW 40.912.9 81.111.5 - 91.415.5 %   Platelets 229 150 - 400 K/uL   nRBC 0.0 0.0 - 0.2 %  Urinalysis, Routine w reflex microscopic  Result Value Ref Range   Color, Urine YELLOW YELLOW   APPearance HAZY (A) CLEAR   Specific Gravity, Urine 1.026 1.005 - 1.030   pH 5.0 5.0 - 8.0   Glucose, UA NEGATIVE NEGATIVE mg/dL   Hgb urine dipstick SMALL (A) NEGATIVE   Bilirubin Urine NEGATIVE NEGATIVE   Ketones, ur 80 (A) NEGATIVE mg/dL   Protein, ur 30 (A) NEGATIVE mg/dL   Nitrite NEGATIVE NEGATIVE   Leukocytes,Ua NEGATIVE NEGATIVE   RBC / HPF 0-5 0 - 5 RBC/hpf   WBC, UA 0-5 0 - 5 WBC/hpf   Bacteria, UA FEW (A) NONE SEEN   Squamous Epithelial / LPF 0-5 0 - 5   Mucus PRESENT   I-Stat beta hCG blood, ED  Result Value Ref Range   I-stat hCG, quantitative <5.0 <5 mIU/mL   Comment 3            EKG None  Radiology No results found.  Procedures Procedures (including critical care time)  Medications Ordered in ED Medications  sodium chloride flush (NS) 0.9 % injection 3 mL (has no administration in time range)  ondansetron (ZOFRAN-ODT) disintegrating tablet 4 mg (has no administration in time range)      Initial Impression / Assessment and Plan / ED Course  I have reviewed the triage vital signs and the nursing notes.  Pertinent labs & imaging results that were available during my care of the patient were reviewed by me and considered in my medical decision making (see chart for details).  Melody FarrMakhaila Pavon was evaluated in the Emergency Department on 09/08/18 for the symptoms described in the history of present illness. She was evaluated in the context of the global COVID-19 pandemic, which necessitated consideration that the patient might be at risk for infection with the SARS-CoV-2 virus that causes COVID-19. Institutional protocols and algorithms that pertain to the evaluation of patients at risk for COVID-19 are in a state of rapid change based on information released by regulatory bodies including the CDC and federal and state organizations. These policies and algorithms were followed during the patient's care in the ED.         Patient to ED with multiple complaints starting 3 days ago, including persistent nausea, bilateral headache (mild), upper back pain w/o SOB, cough, ongoing chest discomfort.   Labs are unremarkable. Pregnancy test is negative. VSS. She is nontoxic in appearance. zofran provided for nausea. Will re-evaluate.   No further vomiting. VSS. She can be discharged home  with symptomatic treatment and PCP follow up.   Final Clinical Impressions(s) / ED Diagnoses   Final diagnoses:  None   1. Nausea 2. [facility]  ED Discharge Orders    None       Elpidio AnisUpstill, Araly Kaas, Cordelia Poche-C 09/08/18 0440    Dione BoozeGlick, David, MD 09/08/18 0711    Elpidio AnisUpstill, Plumer Mittelstaedt, PA-C 10/18/18 2248    Dione BoozeGlick, David, MD 10/19/18 1059

## 2018-09-08 NOTE — Discharge Instructions (Signed)
Push fluids. Take Phenergan as directed for nausea.   Follow up with your doctor for recheck this week if symptoms persist.   Return to the emergency department with any high fever, severe pain or uncontrolled vomiting.

## 2018-09-08 NOTE — ED Notes (Signed)
Attempted to discharge pt when she began vomiting again. Provider notified

## 2018-09-08 NOTE — ED Triage Notes (Signed)
Pt arrives here with her mother on arrival is hyperventilating with c/o of toruble breathing and tingling in hands and feet. Pt was seen here last night into this early morning with multiple complaints that pt reports are no better, mother states they were exposed to covid and pt did not receive a covid test last night. Pt has slowed her breathing down now and in no distress.

## 2018-09-09 LAB — SARS CORONAVIRUS 2 (TAT 6-24 HRS): SARS Coronavirus 2: NEGATIVE

## 2018-09-09 MED ORDER — SODIUM CHLORIDE 0.9 % IV BOLUS
1000.0000 mL | Freq: Once | INTRAVENOUS | Status: AC
  Administered 2018-09-09: 1000 mL via INTRAVENOUS

## 2018-09-09 MED ORDER — HYDROXYZINE HCL 25 MG PO TABS: 25.0000 mg | ORAL_TABLET | Freq: Three times a day (TID) | ORAL | 0 refills | Status: AC | PRN

## 2018-09-09 MED ORDER — HYDROXYZINE HCL 25 MG PO TABS
25.0000 mg | ORAL_TABLET | Freq: Once | ORAL | Status: AC
  Administered 2018-09-09: 25 mg via ORAL
  Filled 2018-09-09: qty 1

## 2018-09-09 NOTE — ED Provider Notes (Signed)
[facility] EMERGENCY DEPARTMENT Provider Note   CSN: [id number] Arrival date & time: 09/08/18  1813     History   Chief Complaint Chief Complaint  Patient presents with  . Panic Attack  . Headache    HPI Melody Farley is a 28 y.o. female.     HPI  This is a 28 year old female who presents with nausea, anxiety.  Patient reports that she was seen and evaluated yesterday for nausea.  She has had several day history of worsening nausea.  At times she has some upper abdominal pain but currently is pain free.  She states that she has felt increasing anxiety and has followed up with her PCP.  At times she has palpitations.  She also endorses a bilateral headache that is pressure-like.  Patient reports she has not been able to eat in 2 to 3 days.  Rates her pain and anxiety symptoms at a 6 out of 10.  Patient also reports that she is concerned she may have COVID.  She does report a close contact who tested positive.  She denies fevers, shortness of breath, chest pain.  I reviewed her chart.  Lab work-up from yesterday is largely reassuring.  She does have 80 ketones in her urine which might suggest dehydration.  She is not pregnant.  Other lab work-up is reassuring.  Past Medical History:  Diagnosis Date  . Asthma   . Ovarian cyst   . Pregnancy induced hypertension     Patient Active Problem List   Diagnosis Date Noted  . Elevated blood pressure 09/04/2014  . Cesarean delivery delivered 08/22/2014    Past Surgical History:  Procedure Laterality Date  . CESAREAN SECTION N/A 08/22/2014   Procedure: CESAREAN SECTION;  Surgeon: Delsa Bern, MD;  Location: [facility];  Service: Obstetrics;  Laterality: N/A;  . NO PAST SURGERIES       OB History    Gravida  1   Para  1   Term  1   Preterm      AB      Living  1     SAB      TAB      Ectopic      Multiple  0   Live Births  1            Home Medications    Prior to Admission  medications   Medication Sig Start Date End Date Taking? Authorizing Provider  albuterol (VENTOLIN HFA) 108 (90 Base) MCG/ACT inhaler Inhale 1-2 puffs into the lungs every 6 (six) hours as needed for wheezing or shortness of breath. 08/19/18   Antonietta Breach, PA-C  ferrous sulfate 325 (65 FE) MG tablet Take 1 tablet (325 mg total) by mouth daily. Patient not taking: Reported on 04/27/2015 08/24/14   Waymon Amato, MD  hydrOXYzine (ATARAX/VISTARIL) 25 MG tablet Take 1 tablet (25 mg total) by mouth every 8 (eight) hours as needed. 09/09/18   Elaisha Zahniser, Barbette Hair, MD  ibuprofen (ADVIL,MOTRIN) 600 MG tablet Take 1 tablet (600 mg total) by mouth every 6 (six) hours. Patient not taking: Reported on 04/27/2015 08/24/14   Waymon Amato, MD  oxyCODONE-acetaminophen (PERCOCET/ROXICET) 5-325 MG per tablet Take 1 tablet by mouth every 4 (four) hours as needed (for pain scale 4-7). Patient not taking: Reported on 04/27/2015 08/24/14   Waymon Amato, MD  promethazine (PHENERGAN) 25 MG tablet Take 1 tablet (25 mg total) by mouth every 6 (six) hours as needed for nausea or vomiting.  09/08/18   Elpidio AnisUpstill, Shari, PA-C    Family History Family History  Problem Relation Age of Onset  . Hypertension Mother   . Asthma Mother   . Anemia Mother   . Hypertension Maternal Grandmother   . Arthritis Maternal Grandmother   . Asthma Maternal Grandmother   . Hypothyroidism Maternal Grandmother     Social History Social History   Tobacco Use  . Smoking status: Never Smoker  . Smokeless tobacco: Never Used  Substance Use Topics  . Alcohol use: No    Comment: occ  . Drug use: No     Allergies   Latex   Review of Systems Review of Systems  Constitutional: Negative for fever.  Respiratory: Negative for shortness of breath.   Cardiovascular: Positive for palpitations. Negative for chest pain.  Gastrointestinal: Positive for nausea and vomiting. Negative for abdominal pain, constipation and diarrhea.  Genitourinary: Negative for  dysuria and vaginal discharge.  Psychiatric/Behavioral: The patient is nervous/anxious.   All other systems reviewed and are negative.    Physical Exam Updated Vital Signs BP 124/87   Pulse 64   Temp 98.8 F (37.1 C) (Oral)   Resp 17   LMP 08/11/2018 (Exact Date)   SpO2 100%   Physical Exam Vitals signs and nursing note reviewed.  Constitutional:      Appearance: She is well-developed.  HENT:     Head: Normocephalic and atraumatic.     Mouth/Throat:     Comments: Mucous membranes dry Eyes:     Pupils: Pupils are equal, round, and reactive to light.  Neck:     Musculoskeletal: Neck supple.  Cardiovascular:     Rate and Rhythm: Normal rate and regular rhythm.     Heart sounds: Normal heart sounds.  Pulmonary:     Effort: Pulmonary effort is normal. No respiratory distress.     Breath sounds: No wheezing.  Abdominal:     General: Bowel sounds are normal.     Palpations: Abdomen is soft.     Tenderness: There is no abdominal tenderness. There is no guarding.  Skin:    General: Skin is warm and dry.  Neurological:     Mental Status: She is alert and oriented to person, place, and time.     Comments: Cranial nerves II through XII intact, 5 out of 5 strength in all 4 extremity  Psychiatric:     Comments: Calm and cooperative      ED Treatments / Results  Labs (all labs ordered are listed, but only abnormal results are displayed) Labs Reviewed  SARS CORONAVIRUS 2    EKG EKG Interpretation  Date/Time:  Wednesday September 09 2018 03:58:33 EDT Ventricular Rate:  74 PR Interval:    QRS Duration: 77 QT Interval:  373 QTC Calculation: 414 R Axis:   70 Text Interpretation:  Sinus arrhythmia Confirmed by Ross MarcusHorton, Bodin Gorka ([id number]) on 09/09/2018 4:59:24 AM   Radiology No results found.  Procedures Procedures (including critical care time)  Medications Ordered in ED Medications  sodium chloride 0.9 % bolus 1,000 mL (1,000 mLs Intravenous New Bag/Given 09/09/18 0407)   hydrOXYzine (ATARAX/VISTARIL) tablet 25 mg (25 mg Oral Given 09/09/18 0408)     Initial Impression / Assessment and Plan / ED Course  I have reviewed the triage vital signs and the nursing notes.  Pertinent labs & imaging results that were available during my care of the patient were reviewed by me and considered in my medical decision making (see chart for details).  Patient presents with several symptoms.  She had a similar presentation yesterday.  She was treated and discharged after fairly assuring lab work.  She did have some indication of dehydration with ketones in the urine.  Patient was given fluids.  EKG shows no evidence of arrhythmia or ischemia.  She has no significant abdominal tenderness.  After fluids and Zofran, patient states that she feels markedly improved.  She was also given Vistaril for anxiety and feels that those symptoms are improved as well.  We discussed discharge home with Vistaril and close primary care follow-up regarding her anxiety.  Patient is agreeable to plan.  COVID testing was sent and is pending.  Patient encouraged to self isolate until test results return.  Kayler Marmo was Thana Farrevaluated in Emergency Department on 09/09/2018 for the symptoms described in the history of present illness. She was evaluated in the context of the global COVID-19 pandemic, which necessitated consideration that the patient might be at risk for infection with the SARS-CoV-2 virus that causes COVID-19. Institutional protocols and algorithms that pertain to the evaluation of patients at risk for COVID-19 are in a state of rapid change based on information released by regulatory bodies including the CDC and federal and state organizations. These policies and algorithms were followed during the patient's care in the ED.   After history, exam, and medical workup I feel the patient has been appropriately medically screened and is safe for discharge home. Pertinent diagnoses were  discussed with the patient. Patient was given return precautions.   Final Clinical Impressions(s) / ED Diagnoses   Final diagnoses:  Anxiety attack  Nausea  Exposure to Covid-19 Virus  Dehydration    ED Discharge Orders         Ordered    hydrOXYzine (ATARAX/VISTARIL) 25 MG tablet  Every 8 hours PRN     09/09/18 0502           Shon BatonHorton, Kayliegh Boyers F, MD 09/09/18 [phone]

## 2018-09-09 NOTE — Discharge Instructions (Signed)
You were seen today for nausea and anxiety.  You were also tested for COVID.  You are found to be dehydrated.  Make sure to drink plenty of fluids at home.  Your COVID test is pending.  You should self isolate until test results in 2 to 3 days.  Follow up with your primary physician regarding ongoing anxiety issues.  You will be given a short prescription of Vistaril.

## 2020-12-21 IMAGING — DX CHEST - 2 VIEW
2 series · 2 of 2 positions shown · non-contrast
Comparison: 10/26/2013

CLINICAL DATA: Chest pain

EXAM:
CHEST - 2 VIEW

[chest pa]
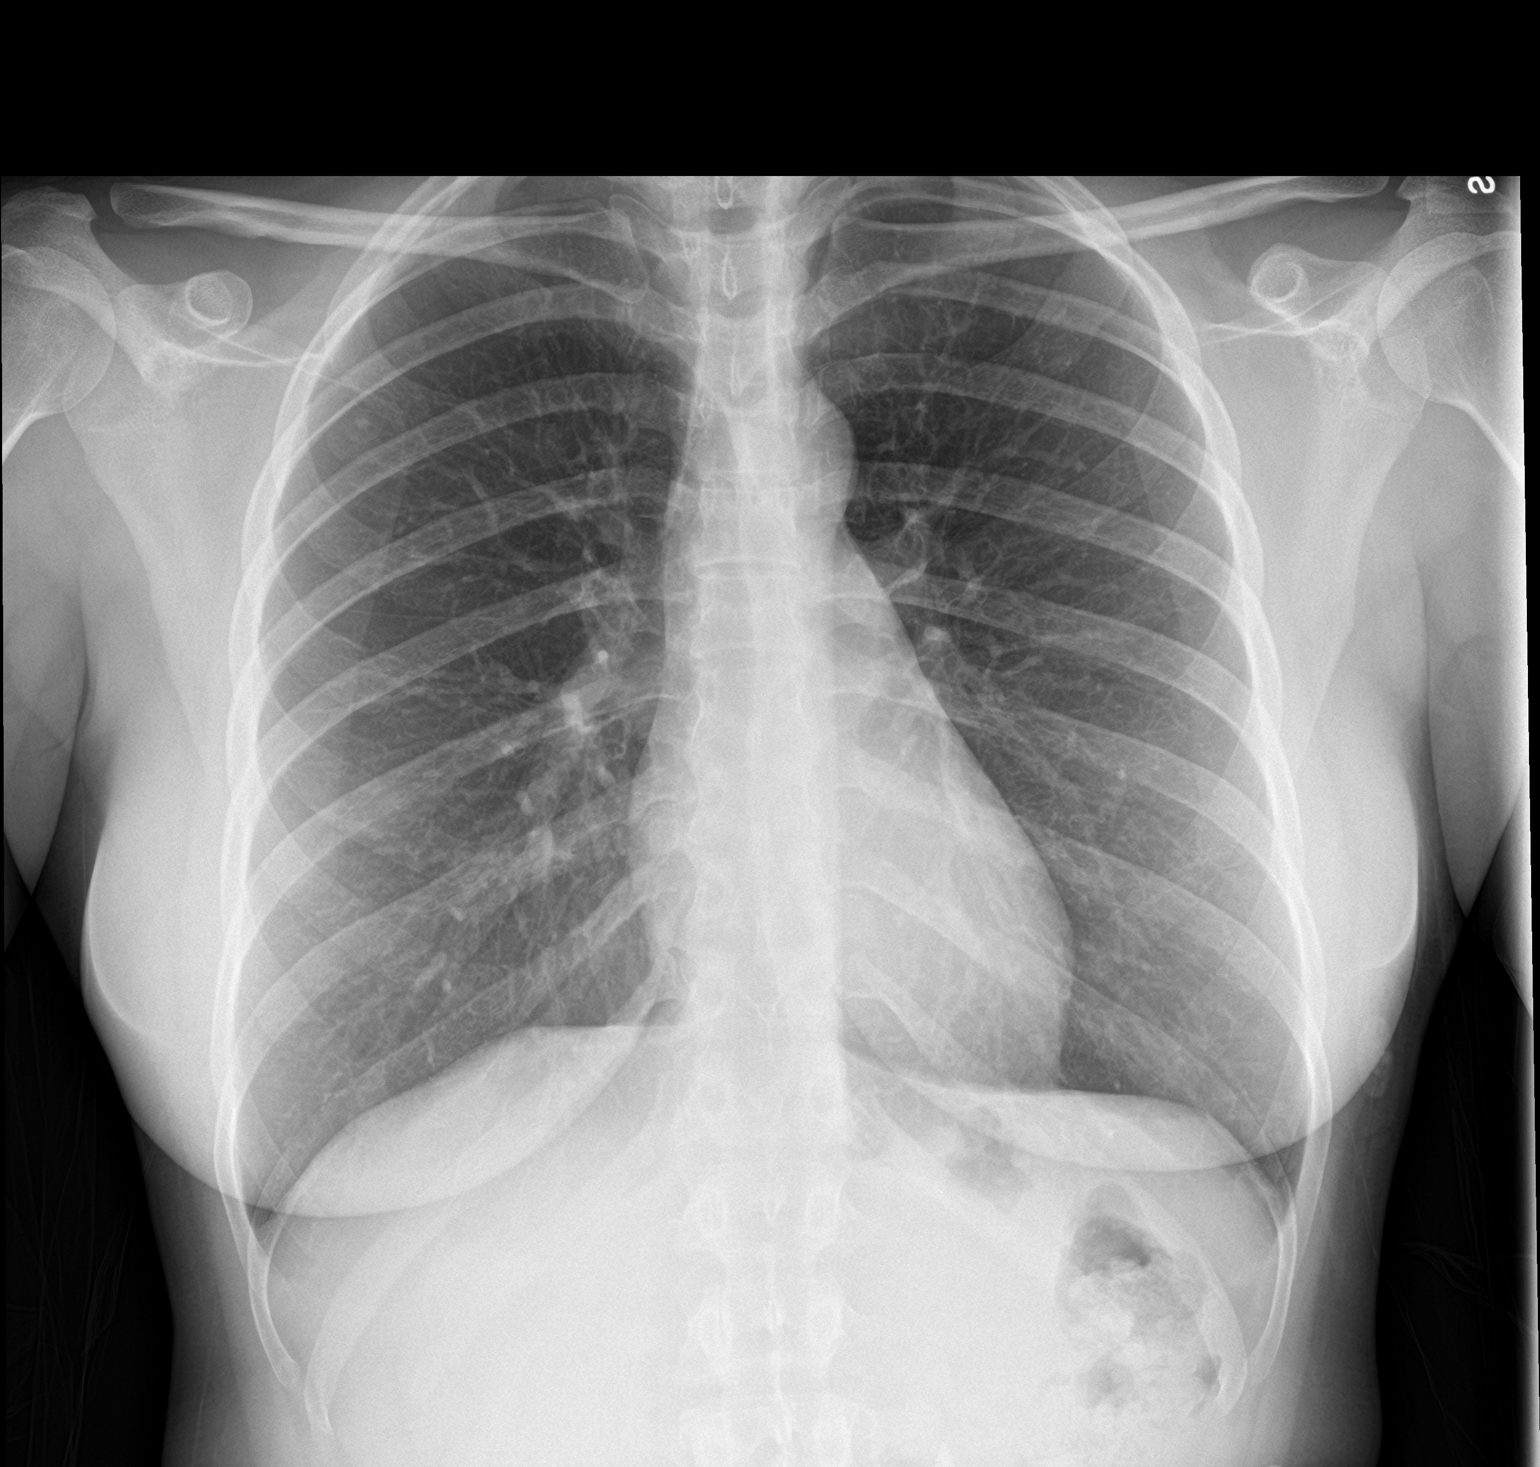

[chest lat]
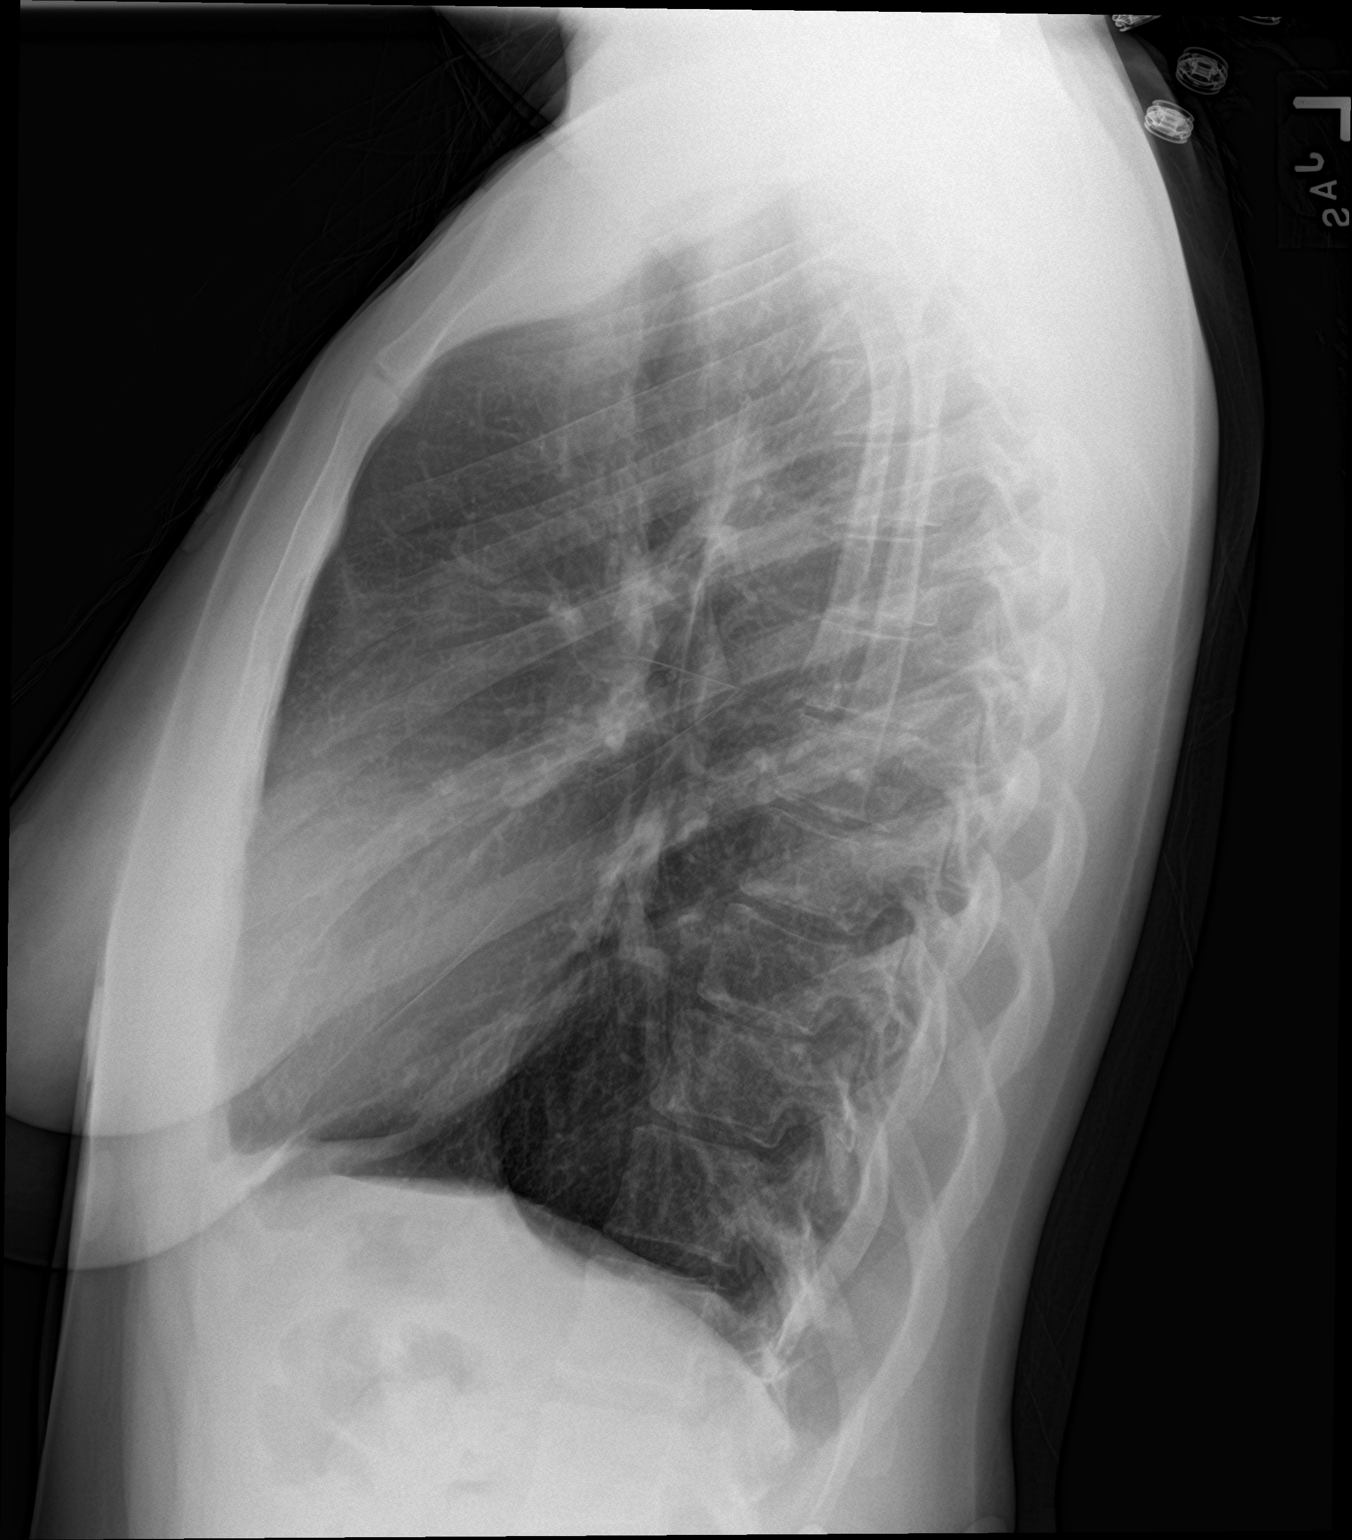

[2 of 2 positions shown; findings below may reference images not displayed]

FINDINGS: The heart size and mediastinal contours are within normal limits.
Both lungs are clear. The visualized skeletal structures are
unremarkable.
IMPRESSION: Negative chest.

## 2023-02-19 ENCOUNTER — Ambulatory Visit (INDEPENDENT_AMBULATORY_CARE_PROVIDER_SITE_OTHER): Payer: 59 | Admitting: Internal Medicine

## 2023-02-19 ENCOUNTER — Other Ambulatory Visit: Payer: Self-pay

## 2023-02-19 ENCOUNTER — Encounter: Payer: Self-pay | Admitting: Internal Medicine

## 2023-02-19 VITALS — BP 108/72 | HR 96 | Temp 98.6°F | Resp 19 | Ht 64.25 in | Wt 172.8 lb

## 2023-02-19 DIAGNOSIS — J4530 Mild persistent asthma, uncomplicated: Secondary | ICD-10-CM

## 2023-02-19 DIAGNOSIS — J3089 Other allergic rhinitis: Secondary | ICD-10-CM | POA: Diagnosis not present

## 2023-02-19 MED ORDER — QVAR REDIHALER 80 MCG/ACT IN AERB: 2.0000 | INHALATION_SPRAY | Freq: Two times a day (BID) | RESPIRATORY_TRACT | 5 refills | Status: AC

## 2023-02-19 MED ORDER — ALBUTEROL SULFATE HFA 108 (90 BASE) MCG/ACT IN AERS: 1.0000 | INHALATION_SPRAY | Freq: Four times a day (QID) | RESPIRATORY_TRACT | 1 refills | Status: AC | PRN

## 2023-02-19 MED ORDER — CETIRIZINE HCL 10 MG PO TABS: 10.0000 mg | ORAL_TABLET | Freq: Every day | ORAL | 5 refills | Status: AC

## 2023-02-19 MED ORDER — MONTELUKAST SODIUM 10 MG PO TABS: 10.0000 mg | ORAL_TABLET | Freq: Every day | ORAL | 5 refills | Status: DC

## 2023-02-19 NOTE — Patient Instructions (Addendum)
 Mild Persistent Asthma: - Maintenance inhaler: continue Qvar  80mcg 2 puffs twice daily.   - Rescue inhaler: Albuterol  2 puffs via spacer or 1 vial via nebulizer every 4-6 hours as needed for respiratory symptoms of shortness of breath or wheezing Asthma control goals:  Full participation in all desired activities (may need albuterol  before activity) Albuterol  use two times or less a week on average (not counting use with activity) Cough interfering with sleep two times or less a month Oral steroids no more than once a year No hospitalizations  Other Allergic Rhinitis: - Use nasal saline rinses before nose sprays such as with Neilmed Sinus Rinse.  Use distilled water.   - Okay to use Afrin for 2-3 days for severe symptoms with cold etc.  Do not use beyond that.  - Use Zyrtec  10 mg daily. Can take extra dose if needed.   - Use Singulair  10mg  daily.  Stop if there are any mood/behavioral changes. - Hold all anti-histamines (Xyzal, Allegra, Zyrtec , Claritin, Benadryl , Pepcid ) 3 days prior to next visit.   Follow up: 1/22 at 9:30 AM for skin testing 1-55
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
## 2023-02-26 ENCOUNTER — Ambulatory Visit: Payer: 59 | Admitting: Internal Medicine

## 2023-03-05 ENCOUNTER — Ambulatory Visit: Payer: 59 | Admitting: Internal Medicine

## 2023-03-05 DIAGNOSIS — J309 Allergic rhinitis, unspecified: Secondary | ICD-10-CM

## 2023-03-11 ENCOUNTER — Ambulatory Visit: Payer: 59 | Admitting: Internal Medicine

## 2023-03-25 ENCOUNTER — Ambulatory Visit: Payer: 59 | Admitting: Internal Medicine

## 2023-04-08 ENCOUNTER — Ambulatory Visit: Payer: 59 | Admitting: Internal Medicine

## 2023-04-08 DIAGNOSIS — J309 Allergic rhinitis, unspecified: Secondary | ICD-10-CM

## 2023-06-16 ENCOUNTER — Ambulatory Visit: Payer: Self-pay | Admitting: Nurse Practitioner

## 2023-06-16 NOTE — Telephone Encounter (Signed)
 Ok thganks. AGree with ER Visit

## 2023-06-16 NOTE — Telephone Encounter (Signed)
 Chief Complaint: SOB x2.5 weeks worsening  Symptoms: Gasping to take a deep breath at times every 20 minutes or so, muscle spasms, intermittent chest pain (6-7/10 pain level) daily becoming more frequent, wheezing 4-5x a week, waking up from sleep due to SOB new issue Frequency: Constant Pertinent Negatives: Patient denies dizziness  Disposition: [x] ED   Additional Notes: Pt states she has been using her emergency inhaler 2-3x a day in past 2.5 weeks. Pt advised to go to ED and have another adult drive her. Pt is agreeable. This RN offered to call pt an ambulance but pt refused. Pt had an appt for 6/20 to establish care with pulmonology but pt wanted to be moved up to first available. Pt scheduled for an appt tmrw afternoon to establish care. This RN explained to pt that this appt does not take the place of her going to ED today. Pt states understanding. This RN told pt to call to cancel appt tmrw if not discharged from hospital in time. Pt is agreeable.   Copied from CRM [id number]. Topic: Clinical - Red Word Triage >> Jun 16, 2023  1:20 PM Chantha C wrote: Red Word that prompted transfer to Nurse Triage: Patient [phone] states symptoms have worsen within 2 weeks, gasping for deep breath, chest pain on and off, muscle spasms, wheezing, using inhaler more like 2-3 times a day. Patient denies dizziness, nor fever. Patient would like to be seen sooner than 07/25/23, willing to see any providers. Please advise. Reason for Disposition  [1] Chest pain lasts > 5 minutes AND [2] occurred in past 3 days (72 hours) (Exception: Feels exactly the same as previously diagnosed heartburn and has accompanying sour taste in mouth.)  Answer Assessment - Initial Assessment Questions 1. RESPIRATORY STATUS: "Describe your breathing?" (e.g., wheezing, shortness of breath, unable to speak, severe coughing)      Wheezing, SOB 2. ONSET: "When did this breathing problem begin?"      Ongoing issue for about 2 years  intermittent, 2.5 weeks worsened 3. PATTERN "Does the difficult breathing come and go, or has it been constant since it started?"      Constant  Answer Assessment - Initial Assessment Questions Chief Complaint: SOB x2.5 weeks worsening  Symptoms: Gasping to take a deep breath at times every 20 minutes or so, muscle spasms, intermittent chest pain (6-7/10 pain level) daily becoming more frequent, wheezing 4-5x a week, waking up from sleep due to SOB new issue  Frequency: Constant  Pertinent Negatives: Patient denies dizziness  Protocols used: Breathing Difficulty-A-AH, Chest Pain-A-AH

## 2023-06-16 NOTE — Progress Notes (Unsigned)
 OV 06/17/2023  Subjective:  Patient ID: Melody Farley, female , DOB: November 25, 1990 , age 33 y.o. , MRN: [id number] , ADDRESS: [id number] Melody Farley [facility] [id number] PCP Angelia Kelp, NP Patient Care Team: Angelia Kelp, NP as PCP - General (Nurse Practitioner)  This Provider for this visit: Treatment Team:  Attending Provider: Maire Scot, MD    06/17/2023 -   Chief Complaint  Patient presents with   Pulmonary Consult    Referred by Ballard Bongo, NP. Pt dx with Asthma age 12.  She c/o increased SOB x 2-3 wks- can occur with or without exertion. She has occ wheezing- mainly at night. She has occ dr cough.      HPI Melody Farley 33 y.o. -presents for follow-up.  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  She reports lifelong asthma.  She is self-referred for asthma.  She tells me that she has had asthma since a child.  She has been using Qvar  for at least 10 years she says she is 100% compliant at 2 times daily each time 2 puffs.  She states in the last year she has had for emergency room visits and needing prednisone but no intubations or no hospitalizations or no ICU visits.  Most recent prednisone was a month or 2 ago.  She says that now in the last 2-3 weeks she has had increased symptoms of worsening shortness of breath cough and wheezing.  She is having to take nebulizer at night just to be able to sleep for a few hours.  She is using rescue inhaler 3-4 times a day.  She states symptoms are too much to bear.  She almost went to the ER yesterday but she says she could not afford ER trips.  Both grandmother maternal and mother have asthma.  The maternal grandmother is on test prior - She used to live in a house that had mold but now she is in a new home for the last month or so and she thinks there might be mold That she does not do any marijuana cocaine heroin or tobacco - At her workplace there are rats and rodents  but she does not know about cockroach. That she is on Singulair  but she is not on ACE inhibitor.  Imaging documented below is clear but she wants to have imaging today because she is worried about the potential abnormal findings   Asthma Control Test ACT Total Score  06/17/2023  3:37 PM 6      Lab Results  Component Value Date   NITRICOXIDE 85 06/17/2023      Latest Reference Range & Units 05/08/09 01:45 10/26/13 11:40  Eosinophils Absolute 0.0 - 0.7 K/uL 0.0 0.4    CT Chest data from date:    Narrative  Clinical Data:  Chest pain and elevated D-dimer.   CT ANGIOGRAPHY CHEST WITH CONTRAST   Technique:  Multidetector CT imaging of the chest was performed using the standard protocol during bolus administration of intravenous contrast.  Multiplanar CT image reconstructions including MIPs were obtained to evaluate the vascular anatomy.   Contrast:  80 ml Omnipaque-300.   Comparison:  Plain films of the chest 05/08/2009 at 1:53 a.m.   Findings:  The study is technically good.  No pulmonary embolus is identified.  There is no pleural or pericardial effusion.  Heart size is normal.  No axillary, hilar mediastinal lymphadenopathy. The lungs  are clear.  Incidentally imaged upper abdomen is unremarkable.  No bony abnormality.   Review of the MIP images confirms the above findings.   IMPRESSION: Negative for pulmonary embolus.  Normal exam.  Provider: Alexa Hymen    CLINICAL DATA:  Chest pain   EXAM: CHEST - 2 VIEW   COMPARISON:  10/26/2013   FINDINGS: The heart size and mediastinal contours are within normal limits. Both lungs are clear. The visualized skeletal structures are unremarkable.   IMPRESSION: Negative chest.     Electronically Signed   By: Aleta Hutch M.D.   On: 08/19/2018 05:35      LAB RESULTS last 96 hours No results found.       has a past medical history of Asthma, Ovarian cyst, Pregnancy induced hypertension, and  Recurrent upper respiratory infection (URI).   reports that she has never smoked. She has never used smokeless tobacco.  Past Surgical History:  Procedure Laterality Date   CESAREAN SECTION N/A 08/22/2014   Procedure: CESAREAN SECTION;  Surgeon: Ona Bidding, MD;  Location: [facility];  Service: Obstetrics;  Laterality: N/A;   NO PAST SURGERIES      Allergies  Allergen Reactions   Penicillins Hives and Rash   Latex Rash    There is no immunization history for the selected administration types on file for this patient.  Family History  Problem Relation Age of Onset   Eczema Mother    Allergic rhinitis Mother    Hypertension Mother    Asthma Mother    Anemia Mother    Eczema Father    Asthma Father    Allergic rhinitis Father    Eczema Sister    Asthma Sister    Allergic rhinitis Sister    Allergic rhinitis Brother    Allergic rhinitis Maternal Aunt    Allergic rhinitis Maternal Uncle    Allergic rhinitis Paternal Aunt    Allergic rhinitis Paternal Uncle    Angioedema Maternal Grandmother    Allergic rhinitis Maternal Grandmother    Hypertension Maternal Grandmother    Arthritis Maternal Grandmother    Asthma Maternal Grandmother    Hypothyroidism Maternal Grandmother    Allergic rhinitis Maternal Grandfather    Allergic rhinitis Paternal Grandmother    Allergic rhinitis Paternal Grandfather      Current Outpatient Medications:    albuterol  (VENTOLIN  HFA) 108 (90 Base) MCG/ACT inhaler, Inhale 1-2 puffs into the lungs every 6 (six) hours as needed for wheezing or shortness of breath., Disp: 18 g, Rfl: 1   cetirizine  (ZYRTEC  ALLERGY) 10 MG tablet, Take 1 tablet (10 mg total) by mouth daily., Disp: 30 tablet, Rfl: 5   Fluticasone-Umeclidin-Vilant (TRELEGY ELLIPTA) 200-62.5-25 MCG/ACT AEPB, Inhale 1 puff into the lungs daily., Disp: , Rfl:    hydrOXYzine  (ATARAX /VISTARIL ) 25 MG tablet, Take 1 tablet (25 mg total) by mouth every 8 (eight) hours as needed., Disp: 15 tablet,  Rfl: 0   ibuprofen  (ADVIL ,MOTRIN ) 600 MG tablet, Take 1 tablet (600 mg total) by mouth every 6 (six) hours., Disp: 30 tablet, Rfl: 0   montelukast  (SINGULAIR ) 10 MG tablet, Take 1 tablet (10 mg total) by mouth daily., Disp: 30 tablet, Rfl: 5   predniSONE (DELTASONE) 10 MG tablet, 4 x 2 days, 3 x 2 days, 2 x 2 days, 1 x 2 days, 1/2 x 2 then stop, Disp: 21 tablet, Rfl: 0   promethazine  (PHENERGAN ) 25 MG tablet, Take 1 tablet (25 mg total) by mouth every 6 (six) hours as needed for  nausea or vomiting., Disp: 15 tablet, Rfl: 0   QVAR  REDIHALER 80 MCG/ACT inhaler, Inhale 2 puffs into the lungs 2 (two) times daily., Disp: 1 each, Rfl: 5      Objective:   Vitals:   06/17/23 1533  BP: 116/80  Pulse: 73  SpO2: 99%  Weight: 163 lb (73.9 kg)  Height: 5\' 4"  (1.626 m)    Estimated body mass index is 27.98 kg/m as calculated from the following:   Height as of this encounter: 5\' 4"  (1.626 m).   Weight as of this encounter: 163 lb (73.9 kg).  @WEIGHTCHANGE @  Filed Weights   06/17/23 1533  Weight: 163 lb (73.9 kg)     Physical Exam   General: No distress. Looks stable O2 at rest: no Cane present: no Sitting in wheel chair: no Frail: no Obese: no Neuro: Alert and Oriented x 3. GCS 15. Speech normal Psych: Pleasant Resp:  Barrel Chest - no.  Wheeze - no, Crackles - no, No overt respiratory distress CVS: Normal heart sounds. Murmurs - no Ext: Stigmata of Connective Tissue Disease - no HEENT: Normal upper airway. PEERL +. No post nasal drip        Assessment:       ICD-10-CM   1. Uncomplicated asthma, unspecified asthma severity, unspecified whether persistent  J45.909 Nitric oxide    DG Chest 2 View    CBC w/Diff    Perennial allergen profile IgE    Pulmonary function test     FENO VERY HIGH    Plan:     Patient Instructions     ICD-10-CM   1. Uncomplicated asthma, unspecified asthma severity, unspecified whether persistent  J45.909 Nitric oxide        Plan  -  check cxr 2 view  - check cbc with diff - check IgE and RAST allergy panel - Take prednisone 40 mg daily x 2 days, then 30mg  daily x 2 days x 20mg  daily x 2 days, then 10mg  daily x 2 days, then 5mg  daily x 2 days and stop - START BREO 200 at 1 puff daily (trekelegy sample x 1)  - STOP QVAR  -Cotninue albuterol  as needed - do PFT in 8 weeks - got ER if worse  Followup  - APP followup 8 weeks after PFT    FOLLOWUP Return in about 8 weeks (around 08/12/2023) for with any of the APPS, Face to Face Visit.    SIGNATURE    Dr. Maire Scot, M.D., F.C.C.P,  Pulmonary and Critical Care Medicine Staff Physician, [facility] Center Director - Interstitial Lung Disease  Program  Pulmonary Fibrosis [facility] at [facility] [place], [place], [address]  Pager: [phone], If no answer or between  15:00h - 7:00h: call [phone] Telephone: [phone]  4:13 PM 06/17/2023

## 2023-06-16 NOTE — Telephone Encounter (Signed)
 Routing to Dr Bertrum Brodie. Pt is scheduled to see you tomorrow. ( 06-17-23)

## 2023-06-17 ENCOUNTER — Ambulatory Visit (INDEPENDENT_AMBULATORY_CARE_PROVIDER_SITE_OTHER): Admitting: Internal Medicine

## 2023-06-17 ENCOUNTER — Ambulatory Visit (INDEPENDENT_AMBULATORY_CARE_PROVIDER_SITE_OTHER)

## 2023-06-17 ENCOUNTER — Encounter: Payer: Self-pay | Admitting: Internal Medicine

## 2023-06-17 VITALS — BP 116/80 | HR 73 | Ht 64.0 in | Wt 163.0 lb

## 2023-06-17 DIAGNOSIS — J45909 Unspecified asthma, uncomplicated: Secondary | ICD-10-CM

## 2023-06-17 LAB — NITRIC OXIDE: Nitric Oxide: 85

## 2023-06-17 MED ORDER — TRELEGY ELLIPTA 200-62.5-25 MCG/ACT IN AEPB: 1.0000 | INHALATION_SPRAY | Freq: Every day | RESPIRATORY_TRACT | Status: DC

## 2023-06-17 MED ORDER — PREDNISONE 10 MG PO TABS: ORAL_TABLET | ORAL | 0 refills | Status: AC

## 2023-06-17 NOTE — Patient Instructions (Addendum)
 ICD-10-CM   1. Uncomplicated asthma, unspecified asthma severity, unspecified whether persistent  J45.909 Nitric oxide        Plan  - check cxr 2 view  - check cbc with diff - check IgE and RAST allergy panel - Take prednisone 40 mg daily x 2 days, then 30mg  daily x 2 days x 20mg  daily x 2 days, then 10mg  daily x 2 days, then 5mg  daily x 2 days and stop - START BREO 200 at 1 puff daily (trekelegy sample x 1)  - STOP QVAR  -Cotninue albuterol  as needed - do PFT in 8 weeks - got ER if worse  Followup  - APP followup 8 weeks after PFT

## 2023-06-18 ENCOUNTER — Ambulatory Visit: Payer: Self-pay | Admitting: Internal Medicine

## 2023-06-18 LAB — CBC WITH DIFFERENTIAL/PLATELET
Basophils Absolute: 0.1 10*3/uL (ref 0.0–0.1)
Basophils Relative: 1.2 % (ref 0.0–3.0)
Eosinophils Absolute: 0.3 10*3/uL (ref 0.0–0.7)
Eosinophils Relative: 4 % (ref 0.0–5.0)
HCT: 42.3 % (ref 36.0–46.0)
Hemoglobin: 13.8 g/dL (ref 12.0–15.0)
Lymphocytes Relative: 32.7 % (ref 12.0–46.0)
Lymphs Abs: 2.1 10*3/uL (ref 0.7–4.0)
MCHC: 32.5 g/dL (ref 30.0–36.0)
MCV: 89.3 fl (ref 78.0–100.0)
Monocytes Absolute: 0.5 10*3/uL (ref 0.1–1.0)
Monocytes Relative: 6.9 % (ref 3.0–12.0)
Neutro Abs: 3.6 10*3/uL (ref 1.4–7.7)
Neutrophils Relative %: 55.2 % (ref 43.0–77.0)
Platelets: 203 10*3/uL (ref 150.0–400.0)
RBC: 4.74 Mil/uL (ref 3.87–5.11)
RDW: 12.9 % (ref 11.5–15.5)
WBC: 6.5 10*3/uL (ref 4.0–10.5)

## 2023-06-18 NOTE — Progress Notes (Signed)
 Cxr clear

## 2023-06-19 LAB — ALLERGEN PROFILE, PERENNIAL ALLERGEN IGE
Alternaria Alternata IgE: 21.2 kU/L — AB
Aspergillus Fumigatus IgE: 1.97 kU/L — AB
Aureobasidi Pullulans IgE: 2.94 kU/L — AB
Candida Albicans IgE: 0.79 kU/L — AB
Cat Dander IgE: 0.4 kU/L — AB
Chicken Feathers IgE: <0.1 kU/L
Cladosporium Herbarum IgE: 3.23 kU/L — AB
Cow Dander IgE: 4.2 kU/L — AB
D Farinae IgE: 11.8 kU/L — AB
D Pteronyssinus IgE: 9.8 kU/L — AB
Dog Dander IgE: 14.3 kU/L — AB
Duck Feathers IgE: <0.1 kU/L
Goose Feathers IgE: 0.1 kU/L — AB
Mouse Urine IgE: 0.32 kU/L — AB
Mucor Racemosus IgE: 0.12 kU/L — AB
Penicillium Chrysogen IgE: 0.84 kU/L — AB
Phoma Betae IgE: 12.7 kU/L — AB
Setomelanomma Rostrat: 15.3 kU/L — AB
Stemphylium Herbarum IgE: 17.6 kU/L — AB

## 2023-07-25 ENCOUNTER — Ambulatory Visit: Admitting: Internal Medicine

## 2023-08-26 ENCOUNTER — Encounter: Payer: Self-pay | Admitting: Primary Care

## 2023-08-26 ENCOUNTER — Encounter

## 2023-08-26 ENCOUNTER — Ambulatory Visit: Admitting: Primary Care

## 2023-08-26 NOTE — Progress Notes (Deleted)
 @  Patient ID: Melody Farley, female    DOB: Nov 02, 1990, 33 y.o.   MRN: [id number]  No chief complaint on file.   Referring provider: Geronimo Amel, MD  HPI:   Allergies  Allergen Reactions   Penicillins Hives and Rash   Latex Rash    There is no immunization history for the selected administration types on file for this patient.  Past Medical History:  Diagnosis Date   Asthma    Ovarian cyst    Pregnancy induced hypertension    Recurrent upper respiratory infection (URI)     Tobacco History: Social History   Tobacco Use  Smoking Status Never  Smokeless Tobacco Never   Counseling given: Not Answered   Outpatient Medications Prior to Visit  Medication Sig Dispense Refill   albuterol  (VENTOLIN  HFA) 108 (90 Base) MCG/ACT inhaler Inhale 1-2 puffs into the lungs every 6 (six) hours as needed for wheezing or shortness of breath. 18 g 1   cetirizine  (ZYRTEC  ALLERGY) 10 MG tablet Take 1 tablet (10 mg total) by mouth daily. 30 tablet 5   Fluticasone-Umeclidin-Vilant (TRELEGY ELLIPTA ) 200-62.5-25 MCG/ACT AEPB Inhale 1 puff into the lungs daily.     hydrOXYzine  (ATARAX /VISTARIL ) 25 MG tablet Take 1 tablet (25 mg total) by mouth every 8 (eight) hours as needed. 15 tablet 0   ibuprofen  (ADVIL ,MOTRIN ) 600 MG tablet Take 1 tablet (600 mg total) by mouth every 6 (six) hours. 30 tablet 0   montelukast  (SINGULAIR ) 10 MG tablet Take 1 tablet (10 mg total) by mouth daily. 30 tablet 5   predniSONE  (DELTASONE ) 10 MG tablet 4 x 2 days, 3 x 2 days, 2 x 2 days, 1 x 2 days, 1/2 x 2 then stop 21 tablet 0   promethazine  (PHENERGAN ) 25 MG tablet Take 1 tablet (25 mg total) by mouth every 6 (six) hours as needed for nausea or vomiting. 15 tablet 0   QVAR  REDIHALER 80 MCG/ACT inhaler Inhale 2 puffs into the lungs 2 (two) times daily. 1 each 5   No facility-administered medications prior to visit.      Review of Systems  Review of Systems   Physical Exam  There were no vitals  taken for this visit. Physical Exam   Lab Results:  CBC    Component Value Date/Time   WBC 6.5 06/17/2023 1632   RBC 4.74 06/17/2023 1632   HGB 13.8 06/17/2023 1632   HCT 42.3 06/17/2023 1632   PLT 203.0 06/17/2023 1632   MCV 89.3 06/17/2023 1632   MCH 27.8 09/07/2018 1953   MCHC 32.5 06/17/2023 1632   RDW 12.9 06/17/2023 1632   LYMPHSABS 2.1 06/17/2023 1632   MONOABS 0.5 06/17/2023 1632   EOSABS 0.3 06/17/2023 1632   BASOSABS 0.1 06/17/2023 1632    BMET    Component Value Date/Time   NA 137 09/07/2018 1953   K 4.4 09/07/2018 1953   CL 106 09/07/2018 1953   CO2 20 (L) 09/07/2018 1953   GLUCOSE 106 (H) 09/07/2018 1953   BUN <5 (L) 09/07/2018 1953   CREATININE 0.72 09/07/2018 1953   CALCIUM 9.4 09/07/2018 1953   GFRNONAA >60 09/07/2018 1953   GFRAA >60 09/07/2018 1953    BNP No results found for: BNP  ProBNP No results found for: PROBNP  Imaging: No results found.   Assessment & Plan:   No problem-specific Assessment & Plan notes found for this encounter.     Melody LELON Ferrari, NP 08/26/2023

## 2023-10-11 ENCOUNTER — Telehealth: Payer: Self-pay | Admitting: Internal Medicine

## 2023-10-11 DIAGNOSIS — J4550 Severe persistent asthma, uncomplicated: Secondary | ICD-10-CM

## 2023-10-11 NOTE — Telephone Encounter (Signed)
 Saw her in may and advised PFT and followup butdo not see either. Allergy test is positive. She has asthma. Made  a referral to allergy practice. Please let her know  Thanks

## 2023-10-13 NOTE — Telephone Encounter (Signed)
 ATC x1. Left detailed vm (DPR) regarding Dr. Geronimo note and to call back to schedule PFT and Follow-up visit after PFT has been completed.  Routing to front staff to schedule PFT & OV.

## 2023-10-16 ENCOUNTER — Ambulatory Visit: Admitting: Internal Medicine

## 2023-10-16 NOTE — Telephone Encounter (Signed)
 2nd attempt. Called PT no answer unable to leave VM.

## 2023-10-17 NOTE — Telephone Encounter (Signed)
 3rd attempt Called PT no answer unable to leave VM.

## 2023-10-31 ENCOUNTER — Telehealth (HOSPITAL_BASED_OUTPATIENT_CLINIC_OR_DEPARTMENT_OTHER): Payer: Self-pay

## 2023-10-31 NOTE — Telephone Encounter (Signed)
 Copied from CRM #[id number]. Topic: Clinical - Medical Advice >> Oct 28, 2023 12:00 PM Rozanna MATSU wrote: Reason for CRM: Norman with Allergy clinic called about this patient stated ;they saw her 02/2023 for new pt appt, but pt has no show or cancelled her follow appts with Dr. Tobie so no Allergy test was done.

## 2023-11-09 NOTE — Telephone Encounter (Signed)
 Blood tst in May 2025 with us  shows significant allergies and allergic asthma. I see NO followup  Seems she did not follow with allergy folks either  Plan  - pls establish followup witth an APP - if she does not respond to call -> send crtified letter      Latest Reference Range & Units 06/17/23 16:32  WBC 4.0 - 10.5 K/uL 6.5  RBC 3.87 - 5.11 Mil/uL 4.74  Hemoglobin 12.0 - 15.0 g/dL 86.1  HCT 63.9 - 53.9 % 42.3  MCV 78.0 - 100.0 fl 89.3  MCHC 30.0 - 36.0 g/dL 67.4  RDW 88.4 - 84.4 % 12.9  Platelets 150.0 - 400.0 K/uL 203.0  Neutrophils 43.0 - 77.0 % 55.2  Lymphocytes 12.0 - 46.0 % 32.7  Monocytes Relative 3.0 - 12.0 % 6.9  Eosinophil 0.0 - 5.0 % 4.0  Basophil 0.0 - 3.0 % 1.2  NEUT# 1.4 - 7.7 K/uL 3.6  Lymphs Abs 0.7 - 4.0 K/uL 2.1  Monocyte # 0.1 - 1.0 K/uL 0.5  Eosinophils Absolute 0.0 - 0.7 K/uL 0.3  Basophils Absolute 0.0 - 0.1 K/uL 0.1  Class Description Allergens  Comment  D Pteronyssinus IgE Class IV kU/L 9.80 !  D Farinae IgE Class IV kU/L 11.80 !  Cat Dander IgE Class I kU/L 0.40 !  Dog Dander IgE Class IV kU/L 14.30 !  Penicillium Chrysogen IgE Class II kU/L 0.84 !  Cladosporium Herbarum IgE Class III kU/L 3.23 !  Aspergillus Fumigatus IgE Class III kU/L 1.97 !  Mucor Racemosus IgE Class 0/I kU/L 0.12 !  Alternaria Alternata IgE Class V kU/L 21.20 !  Stemphylium Herbarum IgE Class IV kU/L 17.60 !  Goose Feathers IgE Class 0/I kU/L 0.10 !  Chicken Feathers IgE Class 0 kU/L <0.10  Duck Feathers IgE Class 0 kU/L <0.10  Mouse Urine IgE Class I kU/L 0.32 !  !: Data is abnormal

## 2023-11-10 NOTE — Telephone Encounter (Signed)
 Please try to schedule pt with APP

## 2023-11-10 NOTE — Telephone Encounter (Signed)
 ATC 1x can't leave VM Sent LTR

## 2023-11-11 NOTE — Telephone Encounter (Signed)
 ATC 2x left VM

## 2024-03-09 ENCOUNTER — Telehealth: Payer: Self-pay

## 2024-03-09 DIAGNOSIS — J4550 Severe persistent asthma, uncomplicated: Secondary | ICD-10-CM

## 2024-03-09 NOTE — Telephone Encounter (Signed)
 Copied from CRM #[id number]. Topic: Clinical - Medication Question >> Mar 04, 2024 10:19 AM Celestine FALCON wrote: Reason for CRM: Pt is waiting on the [employer] patient advocate process to be completed, and is wanting to know if it was possible to acquire some samples of trillegy while she waits.   Pt's phone number is [phone] ok to leave a vm. >> Mar 08, 2024  3:11 PM Dedra B wrote: Patient called to follow up on this.  >> Mar 05, 2024  2:57 PM Joesph PARAS wrote: Patient is calling back to request an update. Advised should hear back Monday, provided clinic has not closed due to inclement weather.  >> Mar 04, 2024  4:10 PM Leila BROCKS wrote: Patient [phone] called earlier today, and has not heard from the office. Patient is asking if there's samples of Fluticasone-Umeclidin-Vilant (TRELEGY ELLIPTA ) 200-62.5-25 MCG/ACT AEPB, while she wait for assistance program from [employer]. Patient states only has a few puffs left Fluticasone-Umeclidin-Vilant (TRELEGY ELLIPTA ) 200-62.5-25 MCG/ACT AEPB and is running low. Per CAL, patient needs to schedule an appointment, PFT order is expired and send a crm to clinical. Informed patient, was last seen 06/17/23 with Dr. Geronimo, follow up in 8 weeks, and was advised to have PFT and follow up with Dr. Geronimo. Patient states she lost her home and insurance and that's why she didn't make it for the appointment. Please place order for PFT, so patient to schedule for both appointment same day. Please call back.     New PFT order has been placed.  Routing to admin to schedule PFT and office visit. Thanks

## 2024-03-09 NOTE — Telephone Encounter (Signed)
 Pt was scheduled

## 2024-03-10 ENCOUNTER — Ambulatory Visit: Admitting: Acute Care

## 2024-03-10 ENCOUNTER — Ambulatory Visit

## 2024-03-10 ENCOUNTER — Encounter: Payer: Self-pay | Admitting: Acute Care

## 2024-03-10 VITALS — BP 120/70 | HR 85 | Temp 98.3°F | Ht 64.0 in | Wt 158.0 lb

## 2024-03-10 DIAGNOSIS — J4550 Severe persistent asthma, uncomplicated: Secondary | ICD-10-CM

## 2024-03-10 DIAGNOSIS — J45998 Other asthma: Secondary | ICD-10-CM

## 2024-03-10 DIAGNOSIS — J4542 Moderate persistent asthma with status asthmaticus: Secondary | ICD-10-CM

## 2024-03-10 DIAGNOSIS — J45909 Unspecified asthma, uncomplicated: Secondary | ICD-10-CM

## 2024-03-10 LAB — PULMONARY FUNCTION TEST
DL/VA % pred: 102 %
DL/VA: 4.67 ml/min/mmHg/L
DLCO unc % pred: 101 %
DLCO unc: 23.58 ml/min/mmHg
FEF 25-75 Post: 2.95 L/s
FEF 25-75 Pre: 2.57 L/s
FEF2575-%Change-Post: 14 %
FEF2575-%Pred-Post: 84 %
FEF2575-%Pred-Pre: 73 %
FEV1-%Change-Post: 3 %
FEV1-%Pred-Post: 95 %
FEV1-%Pred-Pre: 92 %
FEV1-Post: 3.14 L
FEV1-Pre: 3.05 L
FEV1FVC-%Change-Post: 2 %
FEV1FVC-%Pred-Pre: 92 %
FEV6-%Change-Post: 0 %
FEV6-%Pred-Post: 101 %
FEV6-%Pred-Pre: 101 %
FEV6-Post: 3.95 L
FEV6-Pre: 3.93 L
FEV6FVC-%Pred-Post: 101 %
FEV6FVC-%Pred-Pre: 101 %
FVC-%Change-Post: 0 %
FVC-%Pred-Post: 100 %
FVC-%Pred-Pre: 100 %
FVC-Post: 3.95 L
FVC-Pre: 3.93 L
Post FEV1/FVC ratio: 80 %
Post FEV6/FVC ratio: 100 %
Pre FEV1/FVC ratio: 78 %
Pre FEV6/FVC Ratio: 100 %
RV % pred: 147 %
RV: 2.16 L
TLC % pred: 114 %
TLC: 5.99 L

## 2024-03-10 MED ORDER — TRELEGY ELLIPTA 200-62.5-25 MCG/ACT IN AEPB: 1.0000 | INHALATION_SPRAY | Freq: Every day | RESPIRATORY_TRACT | Status: AC

## 2024-03-10 MED ORDER — TRELEGY ELLIPTA 200-62.5-25 MCG/ACT IN AEPB: 1.0000 | INHALATION_SPRAY | Freq: Every day | RESPIRATORY_TRACT | 6 refills | Status: AC

## 2024-03-10 MED ORDER — MONTELUKAST SODIUM 10 MG PO TABS: 10.0000 mg | ORAL_TABLET | Freq: Every day | ORAL | 6 refills | Status: AC

## 2024-03-10 NOTE — Patient Instructions (Addendum)
 It is good to see you today.  I am glad you been doing well and that you have had a stable interval with your asthma. We have reviewed your pulmonary function testing which looks very normal. I have renewed your Trelegy prescription. Take 1 puff once daily Rinse mouth after use We will also give you some samples if we have some available. I have renewed your Singulair . Take one 10 mg tablet daily as you have been doing. Continue albuterol  1 to 2 puffs as needed for shortness of breath or wheezing. No the warning signs of a flare and call to be seen so we can get you treated a soon as possible. For any severe asthma attack please seek emergency care Follow-up with Dr. Geronimo in 6 months. If you need us  sooner or if you are flaring just call to be seen and we will make sure we get you in to be seen.  Please contact office for sooner follow up if symptoms do not improve or worsen or seek emergency care

## 2024-03-10 NOTE — [facility] Notes (Signed)
 "  History of Present Illness Melody Farley is a 34 y.o. female with with allergic asthma followed by Dr. Geronimo.   03/10/2024 Discussed the use of AI scribe software for clinical note transcription with the patient, who gave verbal consent to proceed.  Synopsis Melody Farley 34 y.o. -presents for follow-up.  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  She reports lifelong asthma.  She is self-referred for asthma.  She tells me that she has had asthma since a child.  She has been using Qvar  for at least 10 years she says she is 100% compliant at 2 times daily each time 2 puffs.  She states in the last year she has had for emergency room visits and needing prednisone  but no intubations or no hospitalizations or no ICU visits. She was seen by Dr. Geronimo 06/2023 and started on Trelegy, RAST was done, which was + and she was refgerred to Allergy and Asthma. She has not been seen as of yet. PFT's were ordered. CBC done in May did show eosinophils of 0.3.  Pt states her triggers are extreme hot and cold, exertion, any mild cold or upper airway illness.    History of Present Illness Melody Farley is a 34 year old female with asthma who presents for follow-up regarding her asthma management.  She has experienced significant improvement in her asthma symptoms since starting Trelegy. Previously, she frequently used her emergency inhaler, but now only requires it during exertion, such as working out. No nocturnal symptoms are reported, and wheezing occurs only when she is sick or has exerted herself. She does use her albuterol  at these times.  Her current medications include Trelegy, Singulair , and an albuterol  rescue inhaler as needed. She has identified her asthma triggers as allergies, cold weather, exertion, and illness. She has not yet seen an allergy and asthma specialist due to scheduling conflicts.I have re-referred her to allergy.  I have  sent in refills for Trelegy and Singulair .  We have reviewed her PFT's which did not show obstruction or restriction. They were relatively normal.   She has a family history of seasonal allergies, which also trigger her asthma symptoms. She travels from [place] to [place] for her appointments as she prefers her current pulmonologist in [place]. .     Test Results: CBC with Diff Eosinophils 0.3 RAST Panel was +   PFT/s 03/10/2024                Latest Ref Rng & Units 06/17/2023    4:32 PM 09/07/2018    7:53 PM 09/04/2014    9:35 PM  CBC  WBC 4.0 - 10.5 K/uL 6.5  7.6  8.8   Hemoglobin 12.0 - 15.0 g/dL 86.1  87.1  88.9   Hematocrit 36.0 - 46.0 % 42.3  39.9  34.2   Platelets 150.0 - 400.0 K/uL 203.0  229  393        Latest Ref Rng & Units 09/07/2018    7:53 PM 09/04/2014    9:35 PM 08/22/2014   12:40 PM  BMP  Glucose 70 - 99 mg/dL 893  83  86   BUN 6 - 20 mg/dL 5  14  10    Creatinine 0.44 - 1.00 mg/dL 9.27  9.29  9.26   Sodium 135 - 145 mmol/L 137  136  133   Potassium 3.5 - 5.1 mmol/L 4.4  3.9  3.7   Chloride 98 -  111 mmol/L 106  106  108   CO2 22 - 32 mmol/L 20  24  21    Calcium 8.9 - 10.3 mg/dL 9.4  9.1  8.4     BNP No results found for: BNP  ProBNP No results found for: PROBNP  PFT    Component Value Date/Time   FEV1PRE 3.05 03/10/2024 0856   FEV1POST 3.14 03/10/2024 0856   FVCPRE 3.93 03/10/2024 0856   FVCPOST 3.95 03/10/2024 0856   TLC 5.99 03/10/2024 0856   DLCOUNC 23.58 03/10/2024 0856   PREFEV1FVCRT 78 03/10/2024 0856   PSTFEV1FVCRT 80 03/10/2024 0856    No results found.   Past medical hx Past Medical History:  Diagnosis Date   Asthma    Ovarian cyst    Pregnancy induced hypertension    Recurrent upper respiratory infection (URI)      Social History[1]  Ms.Cape reports that she has never smoked. She has never used smokeless tobacco. She reports that she does not drink alcohol and does not use drugs.  Tobacco  Cessation: Counseling given: Not Answered Never smoker   Past surgical hx, Family hx, Social hx all reviewed.  Current Outpatient Medications on File Prior to Visit  Medication Sig   albuterol  (VENTOLIN  HFA) 108 (90 Base) MCG/ACT inhaler Inhale 1-2 puffs into the lungs every 6 (six) hours as needed for wheezing or shortness of breath.   Fluticasone-Umeclidin-Vilant (TRELEGY ELLIPTA ) 200-62.5-25 MCG/ACT AEPB Inhale 1 puff into the lungs daily.   montelukast  (SINGULAIR ) 10 MG tablet Take 1 tablet (10 mg total) by mouth daily.   cetirizine  (ZYRTEC  ALLERGY) 10 MG tablet Take 1 tablet (10 mg total) by mouth daily.   hydrOXYzine  (ATARAX /VISTARIL ) 25 MG tablet Take 1 tablet (25 mg total) by mouth every 8 (eight) hours as needed.   ibuprofen  (ADVIL ,MOTRIN ) 600 MG tablet Take 1 tablet (600 mg total) by mouth every 6 (six) hours.   predniSONE  (DELTASONE ) 10 MG tablet 4 x 2 days, 3 x 2 days, 2 x 2 days, 1 x 2 days, 1/2 x 2 then stop   promethazine  (PHENERGAN ) 25 MG tablet Take 1 tablet (25 mg total) by mouth every 6 (six) hours as needed for nausea or vomiting.   QVAR  REDIHALER 80 MCG/ACT inhaler Inhale 2 puffs into the lungs 2 (two) times daily.   No current facility-administered medications on file prior to visit.     Allergies[2]  Review Of Systems:  Constitutional:   No  weight loss, night sweats,  Fevers, chills, fatigue, or  lassitude.  HEENT:   No headaches,  Difficulty swallowing,  Tooth/dental problems, or  Sore throat,                No sneezing, itching, ear ache, nasal congestion, post nasal drip,   CV:  No chest pain,  Orthopnea, PND, swelling in lower extremities, anasarca, dizziness, palpitations, syncope.   GI  No heartburn, indigestion, abdominal pain, nausea, vomiting, diarrhea, change in bowel habits, loss of appetite, bloody stools.   Resp: No shortness of breath with exertion or at rest.  No excess mucus, no productive cough,  No non-productive cough,  No coughing up of  blood.  No change in color of mucus.  No wheezing.  No chest wall deformity  Skin: no rash or lesions.  GU: no dysuria, change in color of urine, no urgency or frequency.  No flank pain, no hematuria   MS:  No joint pain or swelling.  No decreased range of motion.  No  back pain.  Psych:  No change in mood or affect. No depression or anxiety.  No memory loss.   Vital Signs BP 120/70   Pulse 85   Temp 98.3 F (36.8 C) (Oral)   Ht 5' 4 (1.626 m)   Wt 158 lb (71.7 kg)   SpO2 97% Comment: RA  BMI 27.12 kg/m    Physical Exam:  Physical Exam GENERAL: No distress, alert and oriented times 3. EARS NOSE THROAT: No sinus tenderness, tympanic membranes clear, pale nasal mucosa, no oral exudate, no post nasal drip, no lymphadenopathy. CHEST: Lungs clear to auscultation, no wheeze, rales, dullness, no accessory muscle use, no nasal flaring, no sternal retractions. CARDIAC: S1, S2, regular rate and rhythm, no murmur. ABDOMINAL: Soft, non tender. ND, BS present,Body mass index is 27.12 kg/m.  EXTREMITIES: No clubbing, cyanosis, edema. No obvious deformities. NEUROLOGICAL: Normal strength. Alert and oriented x 3, MAE x 4. SKIN: No rashes, warm and dry. No obvious skin lesions. PSYCHIATRIC: Normal mood and behavior.   Assessment/Plan  Assessment and Plan Assessment & Plan Moderate persistent allergic asthma Well-controlled with Trelegy and Singulair .  Reduced rescue inhaler use since Trelegy.  No nocturnal symptoms or wheezing unless ill.  Normal pulmonary function tests.  Allergic triggers: pollen, cold weather, exertion, illness. - Continue Trelegy daily. - Continue Singulair  daily. - Use albuterol  rescue inhaler as needed. - Rescheduled appointment with allergy and asthma specialist. - Provided six refills for Trelegy and Singulair . - Provided samples of Trelegy. - Advised to seek medical attention if experiencing an asthma attack. - Follow up with Dr. Barnett in 6  months. - Call to be seen sooner for any signs of asthma flare - Please contact office for sooner follow up if symptoms do not improve or worsen or seek emergency care      I spent 20 minutes dedicated to the care of this patient on the date of this encounter to include pre-visit review of records, face-to-face time with the patient discussing conditions above, post visit ordering of testing, clinical documentation with the electronic health record, making appropriate referrals as documented, and communicating necessary information to the patient's healthcare team.    Lauraine JULIANNA Lites, NP 03/10/2024  10:05 AM             [1]  Social History Tobacco Use   Smoking status: Never   Smokeless tobacco: Never  Substance Use Topics   Alcohol use: No    Comment: occ   Drug use: No  [2]  Allergies Allergen Reactions   Penicillins Hives and Rash   Latex Rash   "

## 2024-03-10 NOTE — Patient Instructions (Signed)
 Full pft performed today

## 2024-03-10 NOTE — [facility] Notes (Signed)
 Full pft performed today
